# Patient Record
Sex: Male | Born: 1970 | Race: White | Hispanic: No | Marital: Married | Smoking: Never smoker
Health system: Southern US, Community
[De-identification: ages and names within clinical notes are randomized; demographics above are authoritative.]

## PROBLEM LIST (undated history)

## (undated) DIAGNOSIS — K219 Gastro-esophageal reflux disease without esophagitis: Secondary | ICD-10-CM

## (undated) DIAGNOSIS — J302 Other seasonal allergic rhinitis: Secondary | ICD-10-CM

## (undated) DIAGNOSIS — E785 Hyperlipidemia, unspecified: Secondary | ICD-10-CM

## (undated) DIAGNOSIS — U071 COVID-19: Secondary | ICD-10-CM

## (undated) DIAGNOSIS — M1990 Unspecified osteoarthritis, unspecified site: Secondary | ICD-10-CM

## (undated) DIAGNOSIS — Z789 Other specified health status: Secondary | ICD-10-CM

## (undated) DIAGNOSIS — Z9889 Other specified postprocedural states: Secondary | ICD-10-CM

## (undated) HISTORY — DX: Gastro-esophageal reflux disease without esophagitis: K21.9

## (undated) HISTORY — DX: Other specified postprocedural states: Z98.890

## (undated) HISTORY — DX: Other specified health status: Z78.9

## (undated) HISTORY — DX: Hyperlipidemia, unspecified: E78.5

## (undated) HISTORY — PX: INGUINAL HERNIA REPAIR: SUR1180

## (undated) HISTORY — DX: COVID-19: U07.1

## (undated) HISTORY — DX: Other seasonal allergic rhinitis: J30.2

## (undated) HISTORY — PX: ARTHROSCOPY KNEE W/ DRILLING: SUR92

## (undated) HISTORY — PX: KNEE ARTHROSCOPY W/ ACL RECONSTRUCTION: SHX1858

## (undated) HISTORY — PX: UPPER GASTROINTESTINAL ENDOSCOPY: SHX188

## (undated) HISTORY — PX: COLONOSCOPY: SHX174

## (undated) HISTORY — PX: CHOLECYSTECTOMY: SHX55

## (undated) HISTORY — DX: Unspecified osteoarthritis, unspecified site: M19.90

---

## 2006-11-29 ENCOUNTER — Ambulatory Visit: Payer: Self-pay | Admitting: Internal Medicine

## 2008-11-03 ENCOUNTER — Ambulatory Visit: Payer: Self-pay | Admitting: Cardiology

## 2009-09-22 ENCOUNTER — Encounter: Admission: RE | Admit: 2009-09-22 | Discharge: 2009-09-22 | Payer: Self-pay | Admitting: Neurosurgery

## 2010-10-08 ENCOUNTER — Encounter: Payer: Self-pay | Admitting: Neurosurgery

## 2011-03-02 DIAGNOSIS — R002 Palpitations: Secondary | ICD-10-CM

## 2011-03-02 DIAGNOSIS — R0789 Other chest pain: Secondary | ICD-10-CM

## 2013-01-20 ENCOUNTER — Other Ambulatory Visit: Payer: Self-pay | Admitting: Neurosurgery

## 2013-01-20 DIAGNOSIS — M4716 Other spondylosis with myelopathy, lumbar region: Secondary | ICD-10-CM

## 2013-01-29 ENCOUNTER — Ambulatory Visit
Admission: RE | Admit: 2013-01-29 | Discharge: 2013-01-29 | Disposition: A | Discharge status: Home / Self Care | Payer: BC Managed Care – PPO | Source: Ambulatory Visit | Attending: Neurosurgery | Admitting: Neurosurgery

## 2013-01-29 DIAGNOSIS — M4716 Other spondylosis with myelopathy, lumbar region: Secondary | ICD-10-CM

## 2014-05-12 ENCOUNTER — Encounter (INDEPENDENT_AMBULATORY_CARE_PROVIDER_SITE_OTHER): Payer: Self-pay | Admitting: *Deleted

## 2014-08-09 ENCOUNTER — Encounter (INDEPENDENT_AMBULATORY_CARE_PROVIDER_SITE_OTHER): Payer: Self-pay | Admitting: Internal Medicine

## 2014-08-09 ENCOUNTER — Ambulatory Visit (INDEPENDENT_AMBULATORY_CARE_PROVIDER_SITE_OTHER): Payer: BC Managed Care – PPO | Admitting: Internal Medicine

## 2014-08-09 VITALS — BP 110/80 | HR 76 | Temp 99.0°F | Resp 18 | Ht 72.0 in | Wt 195.1 lb

## 2014-08-09 DIAGNOSIS — R1310 Dysphagia, unspecified: Secondary | ICD-10-CM

## 2014-08-09 DIAGNOSIS — K219 Gastro-esophageal reflux disease without esophagitis: Secondary | ICD-10-CM

## 2014-08-09 DIAGNOSIS — R1011 Right upper quadrant pain: Secondary | ICD-10-CM | POA: Insufficient documentation

## 2014-08-09 DIAGNOSIS — M549 Dorsalgia, unspecified: Secondary | ICD-10-CM

## 2014-08-09 DIAGNOSIS — M542 Cervicalgia: Secondary | ICD-10-CM | POA: Insufficient documentation

## 2014-08-09 DIAGNOSIS — G8929 Other chronic pain: Secondary | ICD-10-CM

## 2014-08-09 NOTE — Patient Instructions (Signed)
Symptom diary as discussed. Patient will call with results of gradient study and upper GI series when completed

## 2014-08-09 NOTE — Progress Notes (Signed)
Presenting complaint;  Right artery and abdominal pain.  History of present illness;  Mario Jones is 43 year old Caucasian male who was referred through courtesy of Dr. Selinda FlavinKevin Howard for GI evaluation. Patient was last seen by me over 6 years ago prior to gallbladder surgery. Patient's present illness began in August last year when he developed diarrhea following beach vacation. His diarrhea persisted. His son also had diarrhea which cleared spontaneously. He did not have rectal bleeding fever or chills. Then on 05/07/2013 following evening meal he developed bloating and intractable pain under the right rib cage. Pain was unbearable and he was [facility] and blood work was abnormal and he was thought to have pancreatitis but no changes of pancreatitis were seen on CT. He was seen in consultation by Dr. Jones Skenehristopher Benson who felt that patient did not have pancreatitis. EGD was performed and was within normal limits. His pain eased and he was discharged and scheduled to have endoscopic ultrasound which was performed in August 2014 at [facility] and was within normal limits. Patient has dull aching pain under the right rib cage which is experienced daily if not constant. He has few moments when he is pain-free. In addition to this dull pain he's had 3 episodes of moderate pain lasting for 20 minutes or longer. Last episode occurred 2 days ago and the 2 other episodes occurred more than 3 months ago. These episodes of sharp pain associated with bloating he denies nausea vomiting fever or chills. He also denies melena or rectal bleeding diarrhea or constipation. Last year he lost 45 pounds he has gained 25 pounds back. He has chronic neck and lower back pain secondary to disc disease at multiple levels and he is under care of Dr. Trey SailorsMark Roy. He does not take pain medication on daily basis. Patient also complains of solid food dysphagia which started a few weeks ago. Heartburn is well controlled  with therapy. Review of the systems is negative for hematuria dysuria or skin rash.   Current Medications: Outpatient Encounter Prescriptions as of 08/09/2014  Medication Sig  . HYDROcodone-acetaminophen (NORCO) 10-325 MG per tablet Take 1 tablet by mouth as needed. For back pain  . loratadine (CLARITIN) 10 MG tablet Take 10 mg by mouth daily.  Marland Kitchen. OVER THE COUNTER MEDICATION Take by mouth 2 (two) times daily. [employer]  . pantoprazole (PROTONIX) 40 MG tablet Take 40 mg by mouth daily.  . traZODone (DESYREL) 100 MG tablet Take 50 mg by mouth at bedtime.   Past medical history; Chronic GERD. He has had symptoms more than 10 years. He has chronic neck and lumbar pain secondary to disc disease at multiple levels. Right inguinal herniorrhaphy 13 years ago. Left knee arthroscopy 2 and there are surgery on the same knee was to repair anterior cruciate ligament tear. The surgeries were performed within last 5 years. Cholecystectomy for poorly functioning gallbladder in 2009.  Allergies; No Known Allergies  Family history; Mother is 7864 and has autoimmune hepatitis. Father is 3165 has malformed artery no further details available. He had one sister who died 5 years ago at age 42. She was morbidly obese and had kidney stones.  Social history; He is married and has 2 children ages 159 and 293. 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 He has never smoked cigarettes and does not drink alcohol.   Objective: Blood pressure 110/80, pulse 76, temperature 99 F (37.2 C), temperature source Oral, resp. rate 18, height  6' (1.829 m), weight 195 lb 1.6 oz (88.497 kg). Patient is alert and in no acute distress. Conjunctiva is pink. Sclera is nonicteric Oropharyngeal mucosa is normal. No neck masses or thyromegaly noted. Cardiac exam with regular rhythm normal S1 and S2. No murmur or gallop noted. Lungs are clear to auscultation. Abdomen is symmetrical. Bowel sounds are  normal. Abdomen is soft. There is mild tenderness below the left costal margin. No tenderness noted in epigastric region. No organomegaly or masses. No LE edema or clubbing noted.  Labs/studies Results:   abdominopelvic study from August 2014 reviewed. Large fluid-filled stomach. No abnormality noted to pancreas.  Assessment:  #1. Patient is 43 year old Caucasian male who presents with 15 month history of right upper quadrant abdominal pain. He had an episode of unbearable pain at onset requiring hospitalization but workup was negative including abdominopelvic CT EGD and endoscopic ultrasound. He has chronic dull pain and sharp pain. Transaminases have been normal. He is status post cholecystectomy about 6 years ago. This pain does not appear to be triggered with meals or physical activity. Given his course I doubt that he has SOD dysfunction. His stomach was quite large on CT of August 2014. This finding may have been due to acute illness but need to rule out gastroparesis. Symptoms are not typical of chronic dyspepsia. Chronic pain may be referred from his back. I do not believe repeating a CT while he is not having acute symptoms would be helpful. Symptom diary may shed more light as to the cause of this pain. #2. Solid food dysphagia in a patient with chronic GERD.  Recommendations;  Patient will call if he has another episode of severe pain or report [facility]. Patient advised to keep symptom diary for the next three months. Will schedule barium pill esophagogram and upper GI series. Office visit in 3 months.

## 2014-08-19 ENCOUNTER — Encounter (INDEPENDENT_AMBULATORY_CARE_PROVIDER_SITE_OTHER): Payer: Self-pay | Admitting: *Deleted

## 2014-08-31 ENCOUNTER — Encounter (INDEPENDENT_AMBULATORY_CARE_PROVIDER_SITE_OTHER): Payer: Self-pay

## 2014-09-16 ENCOUNTER — Encounter (INDEPENDENT_AMBULATORY_CARE_PROVIDER_SITE_OTHER): Payer: Self-pay

## 2014-09-29 ENCOUNTER — Encounter (INDEPENDENT_AMBULATORY_CARE_PROVIDER_SITE_OTHER): Payer: Self-pay

## 2014-10-04 ENCOUNTER — Encounter (INDEPENDENT_AMBULATORY_CARE_PROVIDER_SITE_OTHER): Payer: Self-pay | Admitting: Internal Medicine

## 2014-10-04 ENCOUNTER — Ambulatory Visit (INDEPENDENT_AMBULATORY_CARE_PROVIDER_SITE_OTHER): Payer: BLUE CROSS/BLUE SHIELD | Admitting: Internal Medicine

## 2014-10-04 VITALS — BP 122/74 | HR 80 | Temp 98.1°F | Resp 18 | Ht 72.0 in | Wt 200.4 lb

## 2014-10-04 DIAGNOSIS — K3184 Gastroparesis: Secondary | ICD-10-CM

## 2014-10-04 DIAGNOSIS — K219 Gastro-esophageal reflux disease without esophagitis: Secondary | ICD-10-CM

## 2014-10-04 DIAGNOSIS — R1011 Right upper quadrant pain: Secondary | ICD-10-CM

## 2014-10-04 NOTE — Patient Instructions (Signed)
Gastroparesis diet(pamphlet provided to the patient). Can try lower dose of Vicodin and/or tramadol for back pain. Can also use one of each together. Keep symptom diary for three months and call office with progress report

## 2014-10-04 NOTE — Progress Notes (Signed)
Presenting complaint;  Follow-up for RUQ abdominal pain.  Database;  Patient is 44 year old Caucasian male who was last seen on 08/03/2014 for right upper quadrant pain. He's been having intermittent dull pain but then he has episodes of severe pain. Details of the history are summarized in note from that date and will not be repeated. Following his visit he had esophagogram and upper GI series on 08/16/2014. Barium pill passed through esophagus without delay. There appeared to be delayed in passage of contrast from the stomach to duodenum.Marland Kitchen. He returned for gastric emptying study on 08/30/2014 and he had 47% activity in stomach at 2 hours which is felt to be abnormal. It should be 30% or less at 2 hours. He was also advised to keep symptom diary.   Subjective:  Patient presents for scheduled visit. He has kept symptom diary as recommended. He has had for distinct episodes of right upper quadrant abdominal pain. These episodes occurred on Christmas eve, 09/05/2014, 09/24/2014 and 09/25/2014. All of these episodes occurred exactly 1 hour after taking Vicodin or tramadol. Third episode occurred after he took 2 tablets of Vicodin and rest of the episodes occurred after he took 2 pills of tramadol. He experienced pain in the right upper quadrant and each time it lasted from 2-3 hours. Heartburn is well controlled with PPI. He has gained 5 pounds since his last visit. He states he is been eating too much during the holidays. He continues to complain of back pain but he does not take pain medication on daily basis. He states he did not take any pain medication for 24 hours prior to gastric emptying study.   Current Medications: Outpatient Encounter Prescriptions as of 10/04/2014  Medication Sig  . HYDROcodone-acetaminophen (NORCO) 10-325 MG per tablet Take 1 tablet by mouth as needed. For back pain  . loratadine (CLARITIN) 10 MG tablet Take 10 mg by mouth daily.  Marland Kitchen. OVER THE COUNTER MEDICATION Take  by mouth 2 (two) times daily. [employer]  . pantoprazole (PROTONIX) 40 MG tablet Take 40 mg by mouth daily.  . traZODone (DESYREL) 100 MG tablet Take 50 mg by mouth at bedtime.     Objective: Blood pressure 122/74, pulse 80, temperature 98.1 F (36.7 C), temperature source Oral, resp. rate 18, height 6' (1.829 m), weight 200 lb 6.4 oz (90.901 kg). Patient is alert and in no acute distress. Conjunctiva is pink. Sclera is nonicteric Oropharyngeal mucosa is normal. No neck masses or thyromegaly noted. Abdomen is symmetrical soft and nontender. No succussion splash noted.  No LE edema or clubbing noted.  Labs/studies Results:   results of esophagogram and upper GI series and gastric emptying study reviewed under database.  Assessment:  #1. Intermittent right upper quadrant pain. He has kept symptom diary since his last visit and it is quite interesting to note that all 4 episodes of sharp pain occurred within an hour of taking vicodin and or tramadol. Mechanism could be nonspecific side effect of medication or SOD spasm. By keeping symptom diary we may be able to get to the bottom. #2. Mild gastroparesis. He is on dietary measures and PPI. #3. GERD. Heartburn is well controlled with PPI.   Plan:  Patient will keep symptom diary for another three months. He can try taking 1 Vicodin or 1 tramadol at a time and if he has no problem with either he can combine the two and see what happens. I would also like for him to take Vicodin and  tramadol couple of times when he is not having back pain. If he has prolonged right upper quadrant pain will check LFTs and serum amylase and lipase. He will call with progress report in three months. Office visit in one year or earlier if needed.

## 2014-11-08 ENCOUNTER — Ambulatory Visit (INDEPENDENT_AMBULATORY_CARE_PROVIDER_SITE_OTHER): Payer: BC Managed Care – PPO | Admitting: Internal Medicine

## 2014-11-22 ENCOUNTER — Ambulatory Visit (INDEPENDENT_AMBULATORY_CARE_PROVIDER_SITE_OTHER): Payer: BC Managed Care – PPO | Admitting: Internal Medicine

## 2015-06-10 ENCOUNTER — Encounter (INDEPENDENT_AMBULATORY_CARE_PROVIDER_SITE_OTHER): Payer: Self-pay | Admitting: *Deleted

## 2015-10-05 ENCOUNTER — Ambulatory Visit (INDEPENDENT_AMBULATORY_CARE_PROVIDER_SITE_OTHER): Payer: BLUE CROSS/BLUE SHIELD | Admitting: Internal Medicine

## 2015-12-13 ENCOUNTER — Ambulatory Visit (INDEPENDENT_AMBULATORY_CARE_PROVIDER_SITE_OTHER): Payer: BLUE CROSS/BLUE SHIELD | Admitting: Internal Medicine

## 2015-12-16 ENCOUNTER — Encounter: Payer: Self-pay | Admitting: Cardiology

## 2015-12-16 ENCOUNTER — Ambulatory Visit (HOSPITAL_COMMUNITY)
Admission: RE | Admit: 2015-12-16 | Discharge: 2015-12-16 | Disposition: A | Discharge status: Home / Self Care | Payer: BLUE CROSS/BLUE SHIELD | Source: Ambulatory Visit | Attending: Cardiology | Admitting: Cardiology

## 2015-12-16 ENCOUNTER — Other Ambulatory Visit: Payer: Self-pay | Admitting: Cardiology

## 2015-12-16 ENCOUNTER — Ambulatory Visit (INDEPENDENT_AMBULATORY_CARE_PROVIDER_SITE_OTHER): Payer: BLUE CROSS/BLUE SHIELD | Admitting: Cardiology

## 2015-12-16 VITALS — BP 116/90 | HR 78 | Ht 72.0 in | Wt 207.0 lb

## 2015-12-16 DIAGNOSIS — E785 Hyperlipidemia, unspecified: Secondary | ICD-10-CM

## 2015-12-16 DIAGNOSIS — R072 Precordial pain: Secondary | ICD-10-CM | POA: Diagnosis not present

## 2015-12-16 DIAGNOSIS — I200 Unstable angina: Secondary | ICD-10-CM | POA: Insufficient documentation

## 2015-12-16 DIAGNOSIS — Z8249 Family history of ischemic heart disease and other diseases of the circulatory system: Secondary | ICD-10-CM

## 2015-12-16 DIAGNOSIS — Z01818 Encounter for other preprocedural examination: Secondary | ICD-10-CM | POA: Insufficient documentation

## 2015-12-16 DIAGNOSIS — K219 Gastro-esophageal reflux disease without esophagitis: Secondary | ICD-10-CM | POA: Diagnosis not present

## 2015-12-16 DIAGNOSIS — Z789 Other specified health status: Secondary | ICD-10-CM

## 2015-12-16 NOTE — Patient Instructions (Signed)
Your physician recommends that you schedule a follow-up appointment in: [facility],apt will be made at the hospital  Your physician has requested that you have a cardiac catheterization. Cardiac catheterization is used to diagnose and/or treat various heart conditions. Doctors may recommend this procedure for a number of different reasons. The most common reason is to evaluate chest pain. Chest pain can be a symptom of coronary artery disease (CAD), and cardiac catheterization can show whether plaque is narrowing or blocking your heart's arteries. This procedure is also used to evaluate the valves, as well as measure the blood flow and oxygen levels in different parts of your heart. For further information please visit [email or web address]. Please follow instruction sheet, as given.    Your physician has requested that you have an echocardiogram. Echocardiography is a painless test that uses sound waves to create images of your heart. It provides your doctor with information about the size and shape of your heart and how well your heart's chambers and valves are working. This procedure takes approximately one hour. There are no restrictions for this procedure.     Your physician recommends that you continue on your current medications as directed. Please refer to the Current Medication list given to you today.     Thank you for choosing  [facility] !

## 2015-12-16 NOTE — Progress Notes (Signed)
Cardiology Office Note  Date: 12/16/2015   ID: Mario Jones, DOB 1970/12/27, MRN [id number]  PCP: Selinda Flavin, MD  Consulting Cardiologist: Nona Dell, MD   Chief Complaint  Patient presents with  . Chest pain and fatigue    History of Present Illness: Mario Jones is a 45 y.o. male self-referred for cardiology consultation. He is here with his wife today. He states over the last several months he has been more fatigued with activity. He works as a Arboriculturist, very physical in general. Within the last few months he has also been experiencing a vague discomfort in his left chest, with radiation to the left arm and jaw. He states that he has not had to stop his general activities related to these symptoms, but tends to notice them more at the end of the day when he is home. He has also experienced intermittent palpitations. He does not have any personal history of CAD or cardiomyopathy. Family history of premature CAD is present in his father, his sister also died suddenly in her 9s. She reportedly had an "enlarged heart."   He has a history of hyperlipidemia with statin intolerance, no trouble with hypertension or diabetes mellitus. ECG done today in the office and reviewed by me showed normal sinus rhythm.   He underwent cardiac stress testing approximately 5 or 6 years ago at [facility], does not recall any significant abnormalities. He has not had any interval ischemic testing. He was not having symptoms similar to now at that time.  Today we discussed further evaluation for potential obstructive CAD and cardiomyopathy. We discussed noninvasive and invasive techniques, and after reviewing the risks and benefits, plan is for him to proceed with a diagnostic cardiac catheterization to clearly define coronary anatomy. We will also obtain an echocardiogram to assess cardiac structure and function.  Past Medical History  Diagnosis Date  . GERD (gastroesophageal reflux  disease)   . Seasonal allergies   . Hyperlipidemia   . Osteoarthritis   . Statin intolerance     Past Surgical History  Procedure Laterality Date  . Cholecystectomy    . Inguinal hernia repair    . Colonoscopy    . Upper gastrointestinal endoscopy    . Arthroscopy knee w/ drilling    . Knee arthroscopy w/ acl reconstruction      Current Outpatient Prescriptions  Medication Sig Dispense Refill  . HYDROcodone-acetaminophen (NORCO) 10-325 MG per tablet Take 1 tablet by mouth as needed. For back pain    . loratadine (CLARITIN) 10 MG tablet Take 10 mg by mouth daily.    . pantoprazole (PROTONIX) 40 MG tablet Take 40 mg by mouth daily.    . traZODone (DESYREL) 100 MG tablet Take 50 mg by mouth at bedtime.    . ranitidine (ZANTAC) 150 MG capsule Take 150 mg by mouth every evening.      No current facility-administered medications for this visit.   Allergies:  Review of patient's allergies indicates no known allergies.   Social History: The patient  reports that he has never smoked. He quit smokeless tobacco use about 26 years ago. His smokeless tobacco use included Chew. He reports that he does not drink alcohol or use illicit drugs.   Family History: The patient's family history includes Asthma in his son; Autoimmune disease in his mother; Healthy in his son; Heart disease in his father and sister; Migraines in his mother.   ROS:  Please see the history of present illness. Otherwise,  complete review of systems is positive for none.  All other systems are reviewed and negative.   Physical Exam: VS:  BP 116/90 mmHg  Pulse 78  Ht 6' (1.829 m)  Wt 207 lb (93.895 kg)  BMI 28.07 kg/m2  SpO2 98%, BMI Body mass index is 28.07 kg/(m^2).  Wt Readings from Last 3 Encounters:  12/16/15 207 lb (93.895 kg)  10/04/14 200 lb 6.4 oz (90.901 kg)  08/09/14 195 lb 1.6 oz (88.497 kg)    General: Patient appears comfortable at rest. HEENT: Conjunctiva and lids normal, oropharynx clear with moist  mucosa. Neck: Supple, no elevated JVP or carotid bruits, no thyromegaly. Lungs: Clear to auscultation, nonlabored breathing at rest. Cardiac: Regular rate and rhythm, no S3 or significant systolic murmur, no pericardial rub. Abdomen: Soft, nontender, bowel sounds present, no guarding or rebound. Extremities: No pitting edema, distal pulses 2+. Skin: Warm and dry. Musculoskeletal: No kyphosis. Neuropsychiatric: Alert and oriented x3, affect grossly appropriate.  ECG: ECG is ordered today. No old tracing for comparison.  Other Studies Reviewed Today:  Unable to access old stress test report from [facility].  Previous gastric emptying study from 08/16/2014 showed delayed gastric emptying with 47% retention at 120 minutes. He follows with Dr. Karilyn Cotaehman.  Assessment and Plan:  1. Exertional fatigue with intermittent left-sided chest discomfort radiating to the arm and jaw concerning for accelerating angina symptoms. Reports worsening over the last few months and otherwise generalized fatigue that has been longer standing. He has a family history of premature CAD, personal history of hyperlipidemia with statin intolerance. Otherwise no major cardiac risk factors. ECG today is normal. He underwent stress testing about 5 years ago that was unrevealing, and the present symptoms are new. We discussed both noninvasive and invasive techniques for further cardiac assessment, and after reviewing the risks and benefits, plan is to proceed with a diagnostic cardiac catheterization to clearly define coronary anatomy. We will also obtain an echocardiogram to exclude cardiomyopathy.  2. History of GERD and also delayed gastric emptying. He has seen Dr. Karilyn Cotaehman for this.  3. Hyperlipidemia with statin intolerance.  Current medicines were reviewed with the patient today.   Orders Placed This Encounter  Procedures  . EKG 12-Lead  . Echocardiogram    Disposition: FU with me after cardiac  catheterization.   Signed, Jonelle SidleSamuel G. Aoi Kouns, MD, [facility] 12/16/2015 2:10 PM    [facility] at [facility] [address], [place], [place] [address] Phone: [phone]; Fax: [phone]) [phone]

## 2015-12-21 ENCOUNTER — Encounter: Payer: Self-pay | Admitting: *Deleted

## 2015-12-21 ENCOUNTER — Other Ambulatory Visit: Payer: Self-pay | Admitting: *Deleted

## 2015-12-21 ENCOUNTER — Ambulatory Visit: Payer: Self-pay

## 2015-12-21 ENCOUNTER — Telehealth: Payer: Self-pay | Admitting: Cardiovascular Disease

## 2015-12-21 ENCOUNTER — Other Ambulatory Visit: Payer: Self-pay

## 2015-12-21 ENCOUNTER — Ambulatory Visit (HOSPITAL_COMMUNITY)
Admission: RE | Admit: 2015-12-21 | Discharge: 2015-12-21 | Disposition: A | Discharge status: Home / Self Care | Payer: BLUE CROSS/BLUE SHIELD | Source: Ambulatory Visit | Attending: Cardiovascular Disease | Admitting: Cardiovascular Disease

## 2015-12-21 ENCOUNTER — Other Ambulatory Visit: Payer: Self-pay | Admitting: Cardiovascular Disease

## 2015-12-21 ENCOUNTER — Ambulatory Visit (INDEPENDENT_AMBULATORY_CARE_PROVIDER_SITE_OTHER): Payer: BLUE CROSS/BLUE SHIELD

## 2015-12-21 ENCOUNTER — Telehealth: Payer: Self-pay | Admitting: Cardiology

## 2015-12-21 DIAGNOSIS — I7100 Dissection of unspecified site of aorta: Secondary | ICD-10-CM

## 2015-12-21 DIAGNOSIS — Z0389 Encounter for observation for other suspected diseases and conditions ruled out: Secondary | ICD-10-CM | POA: Diagnosis not present

## 2015-12-21 DIAGNOSIS — K219 Gastro-esophageal reflux disease without esophagitis: Secondary | ICD-10-CM | POA: Diagnosis not present

## 2015-12-21 DIAGNOSIS — R079 Chest pain, unspecified: Secondary | ICD-10-CM | POA: Diagnosis not present

## 2015-12-21 DIAGNOSIS — I200 Unstable angina: Secondary | ICD-10-CM

## 2015-12-21 DIAGNOSIS — Z87891 Personal history of nicotine dependence: Secondary | ICD-10-CM | POA: Diagnosis not present

## 2015-12-21 DIAGNOSIS — E785 Hyperlipidemia, unspecified: Secondary | ICD-10-CM | POA: Diagnosis not present

## 2015-12-21 DIAGNOSIS — Z8249 Family history of ischemic heart disease and other diseases of the circulatory system: Secondary | ICD-10-CM | POA: Diagnosis not present

## 2015-12-21 DIAGNOSIS — Q245 Malformation of coronary vessels: Secondary | ICD-10-CM | POA: Diagnosis not present

## 2015-12-21 DIAGNOSIS — Z79899 Other long term (current) drug therapy: Secondary | ICD-10-CM | POA: Diagnosis not present

## 2015-12-21 MED ORDER — IOHEXOL 350 MG/ML SOLN
100.0000 mL | Freq: Once | INTRAVENOUS | Status: AC | PRN
  Administered 2015-12-21: 100 mL via INTRAVENOUS

## 2015-12-21 NOTE — Progress Notes (Unsigned)
Patient ID: Mario LesserKevin D Jones, male   DOB: June 22, 1971, 45 y.o.   MRN: [id number]  Asked by sonographer to review echocardiographic images suspicious for thoracic aortic dissection flap. Due to system malfunction/technical difficulties, unable to officially complete report at this time. However, there is an area that is certainly suspicious for a dissection flap with noticeable flow in the dissection region. I will order a CT angiography of the chest to evaluate for dissection, as he is in the process of being scheduled for coronary angiography. I have communicated these findings with Dr. Diona BrownerMcDowell, his cardiologist.

## 2015-12-21 NOTE — Telephone Encounter (Signed)
    STAT CTA AORTA DX: AORTIC DISSECTION   [facility] now 12/21/15  -checking percert   (STAT)

## 2015-12-21 NOTE — Telephone Encounter (Signed)
  [facility] report on CT which was ordered to assess for aortic dissection. The study was negative for dissection. Patient was notified of results.   SIMMONS, BRITTAINY 12/21/2015

## 2015-12-22 ENCOUNTER — Encounter (HOSPITAL_COMMUNITY): Payer: Self-pay | Admitting: Cardiovascular Disease

## 2015-12-22 ENCOUNTER — Ambulatory Visit (HOSPITAL_COMMUNITY)
Admission: RE | Admit: 2015-12-22 | Discharge: 2015-12-22 | Disposition: A | Discharge status: Home / Self Care | Payer: BLUE CROSS/BLUE SHIELD | Source: Ambulatory Visit | Attending: Cardiovascular Disease | Admitting: Cardiovascular Disease

## 2015-12-22 ENCOUNTER — Telehealth: Payer: Self-pay | Admitting: *Deleted

## 2015-12-22 ENCOUNTER — Encounter (HOSPITAL_COMMUNITY)
Admission: RE | Disposition: A | Discharge status: Home / Self Care | Payer: Self-pay | Source: Ambulatory Visit | Attending: Cardiovascular Disease

## 2015-12-22 DIAGNOSIS — Z79899 Other long term (current) drug therapy: Secondary | ICD-10-CM | POA: Insufficient documentation

## 2015-12-22 DIAGNOSIS — K219 Gastro-esophageal reflux disease without esophagitis: Secondary | ICD-10-CM | POA: Insufficient documentation

## 2015-12-22 DIAGNOSIS — Z87891 Personal history of nicotine dependence: Secondary | ICD-10-CM | POA: Insufficient documentation

## 2015-12-22 DIAGNOSIS — R079 Chest pain, unspecified: Secondary | ICD-10-CM | POA: Diagnosis not present

## 2015-12-22 DIAGNOSIS — Q245 Malformation of coronary vessels: Secondary | ICD-10-CM | POA: Diagnosis not present

## 2015-12-22 DIAGNOSIS — I208 Other forms of angina pectoris: Secondary | ICD-10-CM | POA: Diagnosis present

## 2015-12-22 DIAGNOSIS — E785 Hyperlipidemia, unspecified: Secondary | ICD-10-CM | POA: Diagnosis not present

## 2015-12-22 DIAGNOSIS — I2089 Other forms of angina pectoris: Secondary | ICD-10-CM | POA: Diagnosis present

## 2015-12-22 DIAGNOSIS — I200 Unstable angina: Secondary | ICD-10-CM

## 2015-12-22 DIAGNOSIS — Z8249 Family history of ischemic heart disease and other diseases of the circulatory system: Secondary | ICD-10-CM | POA: Insufficient documentation

## 2015-12-22 HISTORY — PX: CARDIAC CATHETERIZATION: SHX172

## 2015-12-22 LAB — CBC
HCT: 41.9 % (ref 39.0–52.0)
HEMOGLOBIN: 15.3 g/dL (ref 13.0–17.0)
MCH: 32.1 pg (ref 26.0–34.0)
MCHC: 36.5 g/dL — AB (ref 30.0–36.0)
MCV: 88 fL (ref 78.0–100.0)
Platelets: 277 10*3/uL (ref 150–400)
RBC: 4.76 MIL/uL (ref 4.22–5.81)
RDW: 12.6 % (ref 11.5–15.5)
WBC: 6 10*3/uL (ref 4.0–10.5)

## 2015-12-22 LAB — BASIC METABOLIC PANEL
ANION GAP: 11 (ref 5–15)
BUN: 11 mg/dL (ref 6–20)
CALCIUM: 9.8 mg/dL (ref 8.9–10.3)
CO2: 25 mmol/L (ref 22–32)
Chloride: 104 mmol/L (ref 101–111)
Creatinine, Ser: 0.86 mg/dL (ref 0.61–1.24)
GFR calc Af Amer: >60 mL/min (ref >60)
GFR calc non Af Amer: >60 mL/min (ref >60)
GLUCOSE: 106 mg/dL — AB (ref 65–99)
Potassium: 4.1 mmol/L (ref 3.5–5.1)
SODIUM: 140 mmol/L (ref 135–145)

## 2015-12-22 LAB — PROTIME-INR
INR: 1.01 (ref 0.00–1.49)
PROTHROMBIN TIME: 13.5 s (ref 11.6–15.2)

## 2015-12-22 SURGERY — LEFT HEART CATH AND CORONARY ANGIOGRAPHY
Anesthesia: LOCAL

## 2015-12-22 MED ORDER — ONDANSETRON HCL 4 MG/2ML IJ SOLN: 4.0000 mg | Freq: Four times a day (QID) | INTRAMUSCULAR | Status: DC | PRN

## 2015-12-22 MED ORDER — FENTANYL CITRATE (PF) 100 MCG/2ML IJ SOLN
INTRAMUSCULAR | Status: AC
  Filled 2015-12-22: qty 2

## 2015-12-22 MED ORDER — SODIUM CHLORIDE 0.9% FLUSH: 3.0000 mL | INTRAVENOUS | Status: DC | PRN

## 2015-12-22 MED ORDER — MIDAZOLAM HCL 2 MG/2ML IJ SOLN
INTRAMUSCULAR | Status: DC | PRN
  Administered 2015-12-22: 2 mg via INTRAVENOUS

## 2015-12-22 MED ORDER — ACETAMINOPHEN 325 MG PO TABS: 650.0000 mg | ORAL_TABLET | ORAL | Status: DC | PRN

## 2015-12-22 MED ORDER — MIDAZOLAM HCL 2 MG/2ML IJ SOLN
INTRAMUSCULAR | Status: AC
  Filled 2015-12-22: qty 2

## 2015-12-22 MED ORDER — OXYCODONE-ACETAMINOPHEN 5-325 MG PO TABS
ORAL_TABLET | ORAL | Status: AC
  Filled 2015-12-22: qty 1

## 2015-12-22 MED ORDER — IOPAMIDOL (ISOVUE-370) INJECTION 76%
INTRAVENOUS | Status: DC | PRN
  Administered 2015-12-22: 40 mL via INTRAVENOUS

## 2015-12-22 MED ORDER — HEPARIN (PORCINE) IN NACL 2-0.9 UNIT/ML-% IJ SOLN
INTRAMUSCULAR | Status: DC | PRN
  Administered 2015-12-22: 13:00:00 via INTRA_ARTERIAL

## 2015-12-22 MED ORDER — VERAPAMIL HCL 2.5 MG/ML IV SOLN
INTRAVENOUS | Status: AC
  Filled 2015-12-22: qty 2

## 2015-12-22 MED ORDER — HEPARIN SODIUM (PORCINE) 1000 UNIT/ML IJ SOLN
INTRAMUSCULAR | Status: AC
  Filled 2015-12-22: qty 1

## 2015-12-22 MED ORDER — DIAZEPAM 5 MG PO TABS
ORAL_TABLET | ORAL | Status: AC
  Filled 2015-12-22: qty 1

## 2015-12-22 MED ORDER — DIAZEPAM 5 MG PO TABS
5.0000 mg | ORAL_TABLET | Freq: Once | ORAL | Status: AC
  Administered 2015-12-22: 5 mg via ORAL

## 2015-12-22 MED ORDER — SODIUM CHLORIDE 0.9 % IV SOLN: 250.0000 mL | INTRAVENOUS | Status: DC | PRN

## 2015-12-22 MED ORDER — HEPARIN SODIUM (PORCINE) 1000 UNIT/ML IJ SOLN
INTRAMUSCULAR | Status: DC | PRN
  Administered 2015-12-22: 5000 [IU] via INTRAVENOUS

## 2015-12-22 MED ORDER — LIDOCAINE HCL (PF) 1 % IJ SOLN
INTRAMUSCULAR | Status: DC | PRN
  Administered 2015-12-22: 3 mL via SUBCUTANEOUS

## 2015-12-22 MED ORDER — SODIUM CHLORIDE 0.9 % IV SOLN: INTRAVENOUS | Status: DC

## 2015-12-22 MED ORDER — FENTANYL CITRATE (PF) 100 MCG/2ML IJ SOLN
INTRAMUSCULAR | Status: DC | PRN
  Administered 2015-12-22: 50 ug via INTRAVENOUS

## 2015-12-22 MED ORDER — SODIUM CHLORIDE 0.9% FLUSH: 3.0000 mL | Freq: Two times a day (BID) | INTRAVENOUS | Status: DC

## 2015-12-22 MED ORDER — IOPAMIDOL (ISOVUE-370) INJECTION 76%
INTRAVENOUS | Status: AC
  Filled 2015-12-22: qty 100

## 2015-12-22 MED ORDER — LIDOCAINE HCL (PF) 1 % IJ SOLN
INTRAMUSCULAR | Status: AC
  Filled 2015-12-22: qty 30

## 2015-12-22 MED ORDER — OXYCODONE-ACETAMINOPHEN 5-325 MG PO TABS
1.0000 | ORAL_TABLET | Freq: Once | ORAL | Status: AC
  Administered 2015-12-22: 1 via ORAL

## 2015-12-22 MED ORDER — HEPARIN (PORCINE) IN NACL 2-0.9 UNIT/ML-% IJ SOLN
INTRAMUSCULAR | Status: AC
  Filled 2015-12-22: qty 1000

## 2015-12-22 MED ORDER — SODIUM CHLORIDE 0.9 % IV SOLN
INTRAVENOUS | Status: DC
  Administered 2015-12-22: 08:00:00 via INTRAVENOUS

## 2015-12-22 MED ORDER — HEPARIN (PORCINE) IN NACL 2-0.9 UNIT/ML-% IJ SOLN
INTRAMUSCULAR | Status: DC | PRN
  Administered 2015-12-22: 1000 mL via INTRA_ARTERIAL

## 2015-12-22 MED ORDER — ASPIRIN 81 MG PO CHEW: 81.0000 mg | CHEWABLE_TABLET | ORAL | Status: DC

## 2015-12-22 SURGICAL SUPPLY — 11 items
CATH INFINITI 5 FR JL3.5 (CATHETERS) ×1 IMPLANT
CATH INFINITI 5FR ANG PIGTAIL (CATHETERS) ×1 IMPLANT
CATH INFINITI JR4 5F (CATHETERS) ×1 IMPLANT
DEVICE RAD COMP TR BAND LRG (VASCULAR PRODUCTS) ×1 IMPLANT
GLIDESHEATH SLEND SS 6F .021 (SHEATH) ×1 IMPLANT
KIT HEART LEFT (KITS) ×2 IMPLANT
PACK CARDIAC CATHETERIZATION (CUSTOM PROCEDURE TRAY) ×2 IMPLANT
SYR MEDRAD MARK V 150ML (SYRINGE) ×1 IMPLANT
TRANSDUCER W/STOPCOCK (MISCELLANEOUS) ×2 IMPLANT
TUBING CIL FLEX 10 FLL-RA (TUBING) ×2 IMPLANT
WIRE SAFE-T 1.5MM-J .035X260CM (WIRE) ×2 IMPLANT

## 2015-12-22 NOTE — Telephone Encounter (Signed)
-----   Message from Jonelle SidleSamuel G McDowell, MD sent at 12/21/2015  6:59 PM EDT ----- Reviewed report this evening - out of the office this afternoon. Normal LVEF of 60-65%. Possible abnormality in the aorta was likely artifact since subsequent stat chest CT was negative for aortic dissection. I will review the echo images when back in the office. Continue with same plan as per recent office visit.

## 2015-12-22 NOTE — Telephone Encounter (Signed)
-----   Message from Laqueta LindenSuresh A Koneswaran, MD sent at 12/22/2015  9:36 AM EDT ----- No dissection. Abnormality seen on echocardiographic images likely artifactual in nature.

## 2015-12-22 NOTE — Interval H&P Note (Signed)
Cath Lab Visit (complete for each Cath Lab visit)  Clinical Evaluation Leading to the Procedure:   ACS: No.  Non-ACS:    Anginal Classification: CCS III  Anti-ischemic medical therapy: No Therapy  Non-Invasive Test Results: No non-invasive testing performed  Prior CABG: No previous CABG      History and Physical Interval Note:  12/22/2015 12:32 PM  Mario Jones  has presented today for surgery, with the diagnosis of angina  The various methods of treatment have been discussed with the patient and family. After consideration of risks, benefits and other options for treatment, the patient has consented to  Procedure(s): Left Heart Cath and Coronary Angiography (N/A) as a surgical intervention .  The patient's history has been reviewed, patient examined, no change in status, stable for surgery.  I have reviewed the patient's chart and labs.  Questions were answered to the patient's satisfaction.     Tonny Bollmanooper, Melvine Julin

## 2015-12-22 NOTE — Telephone Encounter (Signed)
Auth # [id number] valid 12/21/15 only

## 2015-12-22 NOTE — Discharge Instructions (Signed)
Radial Site Care °Refer to this sheet in the next few weeks. These instructions provide you with information about caring for yourself after your procedure. Your health care provider may also give you more specific instructions. Your treatment has been planned according to current medical practices, but problems sometimes occur. Call your health care provider if you have any problems or questions after your procedure. °WHAT TO EXPECT AFTER THE PROCEDURE °After your procedure, it is typical to have the following: °· Bruising at the radial site that usually fades within 1-2 weeks. °· Blood collecting in the tissue (hematoma) that may be painful to the touch. It should usually decrease in size and tenderness within 1-2 weeks. °HOME CARE INSTRUCTIONS °· Take medicines only as directed by your health care provider. °· You may shower 24-48 hours after the procedure or as directed by your health care provider. Remove the bandage (dressing) and gently wash the site with plain soap and water. Pat the area dry with a clean towel. Do not rub the site, because this may cause bleeding. °· Do not take baths, swim, or use a hot tub until your health care provider approves. °· Check your insertion site every day for redness, swelling, or drainage. °· Do not apply powder or lotion to the site. °· Do not flex or bend the affected arm for 24 hours or as directed by your health care provider. °· Do not push or pull heavy objects with the affected arm for 24 hours or as directed by your health care provider. °· Do not lift over 10 lb (4.5 kg) for 5 days after your procedure or as directed by your health care provider. °· Ask your health care provider when it is okay to: °¨ Return to work or school. °¨ Resume usual physical activities or sports. °¨ Resume sexual activity. °· Do not drive home if you are discharged the same day as the procedure. Have someone else drive you. °· You may drive 24 hours after the procedure unless otherwise  instructed by your health care provider. °· Do not operate machinery or power tools for 24 hours after the procedure. °· If your procedure was done as an outpatient procedure, which means that you went home the same day as your procedure, a responsible adult should be with you for the first 24 hours after you arrive home. °· Keep all follow-up visits as directed by your health care provider. This is important. °SEEK MEDICAL CARE IF: °· You have a fever. °· You have chills. °· You have increased bleeding from the radial site. Hold pressure on the site. °SEEK IMMEDIATE MEDICAL CARE IF: °· You have unusual pain at the radial site. °· You have redness, warmth, or swelling at the radial site. °· You have drainage (other than a small amount of blood on the dressing) from the radial site. °· The radial site is bleeding, hold steady pressure on the site and call 911. °· Your arm or hand becomes pale, cool, tingly, or numb. °  °This information is not intended to replace advice given to you by your health care provider. Make sure you discuss any questions you have with your health care provider. °  °Document Released: 10/06/2010 Document Revised: 09/24/2014 Document Reviewed: 03/22/2014 °Elsevier Interactive Patient Education ©2016 [employer] ° °

## 2015-12-22 NOTE — H&P (View-Only) (Signed)
Cardiology Office Note  Date: 12/16/2015   ID: Mario Jones, DOB 1970/10/25, MRN [id number]  PCP: Selinda Flavin, MD  Consulting Cardiologist: Nona Dell, MD   Chief Complaint  Patient presents with  . Chest pain and fatigue    History of Present Illness: Mario Jones is a 44 y.o. male self-referred for cardiology consultation. He is here with his wife today. He states over the last several months he has been more fatigued with activity. He works as a Arboriculturist, very physical in general. Within the last few months he has also been experiencing a vague discomfort in his left chest, with radiation to the left arm and jaw. He states that he has not had to stop his general activities related to these symptoms, but tends to notice them more at the end of the day when he is home. He has also experienced intermittent palpitations. He does not have any personal history of CAD or cardiomyopathy. Family history of premature CAD is present in his father, his sister also died suddenly in her 61s. She reportedly had an "enlarged heart."   He has a history of hyperlipidemia with statin intolerance, no trouble with hypertension or diabetes mellitus. ECG done today in the office and reviewed by me showed normal sinus rhythm.   He underwent cardiac stress testing approximately 5 or 6 years ago at [facility], does not recall any significant abnormalities. He has not had any interval ischemic testing. He was not having symptoms similar to now at that time.  Today we discussed further evaluation for potential obstructive CAD and cardiomyopathy. We discussed noninvasive and invasive techniques, and after reviewing the risks and benefits, plan is for him to proceed with a diagnostic cardiac catheterization to clearly define coronary anatomy. We will also obtain an echocardiogram to assess cardiac structure and function.  Past Medical History  Diagnosis Date  . GERD (gastroesophageal reflux  disease)   . Seasonal allergies   . Hyperlipidemia   . Osteoarthritis   . Statin intolerance     Past Surgical History  Procedure Laterality Date  . Cholecystectomy    . Inguinal hernia repair    . Colonoscopy    . Upper gastrointestinal endoscopy    . Arthroscopy knee w/ drilling    . Knee arthroscopy w/ acl reconstruction      Current Outpatient Prescriptions  Medication Sig Dispense Refill  . HYDROcodone-acetaminophen (NORCO) 10-325 MG per tablet Take 1 tablet by mouth as needed. For back pain    . loratadine (CLARITIN) 10 MG tablet Take 10 mg by mouth daily.    . pantoprazole (PROTONIX) 40 MG tablet Take 40 mg by mouth daily.    . traZODone (DESYREL) 100 MG tablet Take 50 mg by mouth at bedtime.    . ranitidine (ZANTAC) 150 MG capsule Take 150 mg by mouth every evening.      No current facility-administered medications for this visit.   Allergies:  Review of patient's allergies indicates no known allergies.   Social History: The patient  reports that he has never smoked. He quit smokeless tobacco use about 26 years ago. His smokeless tobacco use included Chew. He reports that he does not drink alcohol or use illicit drugs.   Family History: The patient's family history includes Asthma in his son; Autoimmune disease in his mother; Healthy in his son; Heart disease in his father and sister; Migraines in his mother.   ROS:  Please see the history of present illness. Otherwise,  complete review of systems is positive for none.  All other systems are reviewed and negative.   Physical Exam: VS:  BP 116/90 mmHg  Pulse 78  Ht 6' (1.829 m)  Wt 207 lb (93.895 kg)  BMI 28.07 kg/m2  SpO2 98%, BMI Body mass index is 28.07 kg/(m^2).  Wt Readings from Last 3 Encounters:  12/16/15 207 lb (93.895 kg)  10/04/14 200 lb 6.4 oz (90.901 kg)  08/09/14 195 lb 1.6 oz (88.497 kg)    General: Patient appears comfortable at rest. HEENT: Conjunctiva and lids normal, oropharynx clear with moist  mucosa. Neck: Supple, no elevated JVP or carotid bruits, no thyromegaly. Lungs: Clear to auscultation, nonlabored breathing at rest. Cardiac: Regular rate and rhythm, no S3 or significant systolic murmur, no pericardial rub. Abdomen: Soft, nontender, bowel sounds present, no guarding or rebound. Extremities: No pitting edema, distal pulses 2+. Skin: Warm and dry. Musculoskeletal: No kyphosis. Neuropsychiatric: Alert and oriented x3, affect grossly appropriate.  ECG: ECG is ordered today. No old tracing for comparison.  Other Studies Reviewed Today:  Unable to access old stress test report from [facility].  Previous gastric emptying study from 08/16/2014 showed delayed gastric emptying with 47% retention at 120 minutes. He follows with Dr. Karilyn Cotaehman.  Assessment and Plan:  1. Exertional fatigue with intermittent left-sided chest discomfort radiating to the arm and jaw concerning for accelerating angina symptoms. Reports worsening over the last few months and otherwise generalized fatigue that has been longer standing. He has a family history of premature CAD, personal history of hyperlipidemia with statin intolerance. Otherwise no major cardiac risk factors. ECG today is normal. He underwent stress testing about 5 years ago that was unrevealing, and the present symptoms are new. We discussed both noninvasive and invasive techniques for further cardiac assessment, and after reviewing the risks and benefits, plan is to proceed with a diagnostic cardiac catheterization to clearly define coronary anatomy. We will also obtain an echocardiogram to exclude cardiomyopathy.  2. History of GERD and also delayed gastric emptying. He has seen Dr. Karilyn Cotaehman for this.  3. Hyperlipidemia with statin intolerance.  Current medicines were reviewed with the patient today.   Orders Placed This Encounter  Procedures  . EKG 12-Lead  . Echocardiogram    Disposition: FU with me after cardiac  catheterization.   Signed, Jonelle SidleSamuel G. Prinston Kynard, MD, [facility] 12/16/2015 2:10 PM    [facility] at [facility] [address], [place], [place] [address] Phone: [phone]; Fax: [phone]

## 2015-12-23 ENCOUNTER — Encounter (HOSPITAL_COMMUNITY): Payer: Self-pay | Admitting: Cardiovascular Disease

## 2015-12-26 ENCOUNTER — Ambulatory Visit (INDEPENDENT_AMBULATORY_CARE_PROVIDER_SITE_OTHER): Payer: BLUE CROSS/BLUE SHIELD | Admitting: Cardiology

## 2015-12-26 ENCOUNTER — Encounter: Payer: Self-pay | Admitting: Cardiology

## 2015-12-26 VITALS — BP 123/87 | HR 78 | Ht 72.0 in | Wt 210.2 lb

## 2015-12-26 DIAGNOSIS — M542 Cervicalgia: Secondary | ICD-10-CM

## 2015-12-26 DIAGNOSIS — R0600 Dyspnea, unspecified: Secondary | ICD-10-CM

## 2015-12-26 DIAGNOSIS — Z889 Allergy status to unspecified drugs, medicaments and biological substances status: Secondary | ICD-10-CM

## 2015-12-26 DIAGNOSIS — E785 Hyperlipidemia, unspecified: Secondary | ICD-10-CM

## 2015-12-26 DIAGNOSIS — R0609 Other forms of dyspnea: Secondary | ICD-10-CM | POA: Diagnosis not present

## 2015-12-26 DIAGNOSIS — Z789 Other specified health status: Secondary | ICD-10-CM

## 2015-12-26 NOTE — Patient Instructions (Signed)
Your physician recommends that you continue on your current medications as directed. Please refer to the Current Medication list given to you today.  Your physician recommends that you schedule a follow-up appointment in: as needed  

## 2015-12-26 NOTE — Telephone Encounter (Signed)
Patient informed during office visit today. 

## 2015-12-26 NOTE — Progress Notes (Signed)
Cardiology Office Note  Date: 12/26/2015   ID: Mario Jones, DOB 27-Feb-1971, MRN [id number]  PCP: Selinda Flavin, MD  Primary Cardiologist: Nona Dell, MD   Chief Complaint  Patient presents with  . Follow-up cardiac catheterization    History of Present Illness: Mario Jones is a 45 y.o. male seen recently in late March and referred for a diagnostic cardiac catheterization to evaluate possible angina symptoms. Fortunately, cardiac catheterization by Dr. 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 He had also undergone an echocardiogram with suspicion of possible ascending aortic dissection flap time at time of reading by Dr. Purvis Sheffield. Ultimately however follow-up CT angiogram of the chest showed normal aorta and no other acute findings.  He comes in today with his wife. We discussed the results of his recent testing which are overall reassuring. No definite acute cardiac process defined that would explain his symptoms. He still reports dyspnea on exertion, sometimes a discomfort on the left side of his neck. I have asked him to follow-up with Dr. Dimas Aguas to continue to evaluate the symptoms for other potential etiologies.  Past Medical History  Diagnosis Date  . GERD (gastroesophageal reflux disease)   . Seasonal allergies   . Hyperlipidemia   . Osteoarthritis   . Statin intolerance   . History of cardiac catheterization     Normal coronaries April 2017    Current Outpatient Prescriptions  Medication Sig Dispense Refill  . HYDROcodone-acetaminophen (NORCO) 10-325 MG per tablet Take 1 tablet by mouth as needed for moderate pain. For back pain    . ibuprofen (ADVIL,MOTRIN) 200 MG tablet Take 400-600 mg by mouth daily as needed for moderate pain.    Marland Kitchen loratadine (CLARITIN) 10 MG tablet Take 10 mg by mouth daily.    . pantoprazole (PROTONIX) 40 MG tablet Take 40 mg by mouth every morning.     .  ranitidine (ZANTAC) 150 MG capsule Take 150 mg by mouth every evening.     . traMADol (ULTRAM) 50 MG tablet Take 50 mg by mouth every 6 (six) hours as needed for moderate pain.   0  . traZODone (DESYREL) 100 MG tablet Take 0.5 tablets by mouth at bedtime.  0   No current facility-administered medications for this visit.   Allergies:  Morphine and related   Social History: The patient  reports that he has never smoked. He quit smokeless tobacco use about 26 years ago. His smokeless tobacco use included Chew. He reports that he does not drink alcohol or use illicit drugs.   ROS:  Please see the history of present illness. Otherwise, complete review of systems is positive for fatigue, dyspnea on exertion, intermittent left neck pain.  All other systems are reviewed and negative.   Physical Exam: VS:  BP 123/87 mmHg  Pulse 78  Ht 6' (1.829 m)  Wt 210 lb 3.2 oz (95.346 kg)  BMI 28.50 kg/m2  SpO2 97%, BMI Body mass index is 28.5 kg/(m^2).  Wt Readings from Last 3 Encounters:  12/26/15 210 lb 3.2 oz (95.346 kg)  12/22/15 195 lb (88.451 kg)  12/16/15 207 lb (93.895 kg)    General: Patient appears comfortable at rest. HEENT: Conjunctiva and lids normal, oropharynx clear. Neck: Supple, no elevated JVP or carotid bruits, no thyromegaly. Lungs: Clear to auscultation, nonlabored breathing at rest. Cardiac: Regular rate and rhythm, no S3 or significant systolic murmur, no pericardial rub. Abdomen: Soft, nontender, no hepatomegaly,  bowel sounds present, no guarding or rebound. Extremities: No pitting edema, distal pulses 2+.  ECG: I personally reviewed the prior tracing from 12/16/2015 which showed normal sinus rhythm.  Recent Labwork: 12/22/2015: BUN 11; Creatinine, Ser 0.86; Hemoglobin 15.3; Platelets 277; Potassium 4.1; Sodium 140   Other Studies Reviewed Today:  Cardiac catheterization 12/22/2015: Coronary Findings    Dominance: Right   Left Main  . Vessel is angiographically normal.      Left Anterior Descending  The LAD is angiographically normal. The mid segment of the LAD demonstrates intramyocardial bridging. There are no obstructive lesions identified.     Ramus Intermedius  The intermediate is a large branch with no obstruction.     Left Circumflex  The circumflex is patent with no obstructive disease.     Right Coronary Artery  . Vessel is angiographically normal.   . Right Posterior Descending Artery   The vessel is angiographically normal.     Echocardiogram 12/21/2015: Study Conclusions  - Left ventricle: The cavity size was normal. Wall thickness was  normal. Systolic function was normal. The estimated ejection  fraction was in the range of 60% to 65%. There was an increased  relative contribution of atrial contraction to ventricular  filling. - Left atrium: The atrium was mildly dilated.  Impressions:  - There is a region of the thoracic aorta that may contain a  dissection flap, with visible flow in the flap itself. Results  communicated with primary cardiologist. A CT angiogram of the  chest has been ordered.  Chest CT angiogram 12/21/2015: FINDINGS: The lungs are well aerated bilaterally. No focal infiltrate, effusion or pneumothorax is seen. No parenchymal nodules are seen.  The thoracic inlet is within normal limits. The thoracic aorta and its branches are unremarkable. Mild cardiac motion artifact is seen although no definitive dissection flap or aneurysmal dilatation is seen. The pulmonary artery as visualized shows no acute abnormality although timing was mainly for arterial evaluation. No hilar or mediastinal adenopathy is seen.  Fatty infiltration of the liver is seen. The gallbladder has been surgically removed. Remainder the upper abdomen is unremarkable. The osseous structures show no acute abnormality.  Review of the MIP images confirms the above findings.  IMPRESSION: No evidence of aortic  dissection.  Chronic changes as described above.  Assessment and Plan:  1. Patient presented with symptoms initially concerning for exertional angina. Subsequent cardiac workup however has been reassuring. He has normal LVEF, no evidence of aortic dissection by chest CT imaging (done based on echocardiographic findings), and normal coronary arteries at cardiac catheterization. 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 I have recommended that he keep close follow-up with Dr. Dimas AguasHoward to investigate symptoms further and consider other etiologies. With left-sided neck pain, it would be worth considering further imaging studies, possibly even CT angiogram or MRI. Although obstructive carotid artery disease is not highly suspected and would generally not cause pain, carotid dissection could be evaluated this way.  2. Hyperlipidemia with history of statin intolerance.  Current medicines were reviewed with the patient today.  Disposition: FU with me as needed.   Signed, Jonelle SidleSamuel G. Ayat Drenning, MD, [facility] 12/26/2015 2:50 PM    [facility] [place] [address], [place], [place] [address] Phone: [phone]; Fax: [phone]

## 2015-12-28 DIAGNOSIS — Z6828 Body mass index (BMI) 28.0-28.9, adult: Secondary | ICD-10-CM | POA: Diagnosis not present

## 2015-12-28 DIAGNOSIS — M791 Myalgia: Secondary | ICD-10-CM | POA: Diagnosis not present

## 2016-01-09 DIAGNOSIS — S83282D Other tear of lateral meniscus, current injury, left knee, subsequent encounter: Secondary | ICD-10-CM | POA: Diagnosis not present

## 2016-01-09 DIAGNOSIS — S82242D Displaced spiral fracture of shaft of left tibia, subsequent encounter for closed fracture with routine healing: Secondary | ICD-10-CM | POA: Diagnosis not present

## 2016-01-09 DIAGNOSIS — T8489XD Other specified complication of internal orthopedic prosthetic devices, implants and grafts, subsequent encounter: Secondary | ICD-10-CM | POA: Diagnosis not present

## 2016-01-23 DIAGNOSIS — S83282D Other tear of lateral meniscus, current injury, left knee, subsequent encounter: Secondary | ICD-10-CM | POA: Diagnosis not present

## 2016-03-15 DIAGNOSIS — M238X2 Other internal derangements of left knee: Secondary | ICD-10-CM | POA: Diagnosis not present

## 2016-03-15 DIAGNOSIS — M4716 Other spondylosis with myelopathy, lumbar region: Secondary | ICD-10-CM | POA: Diagnosis not present

## 2016-05-07 DIAGNOSIS — R21 Rash and other nonspecific skin eruption: Secondary | ICD-10-CM | POA: Diagnosis not present

## 2016-05-07 DIAGNOSIS — Z6828 Body mass index (BMI) 28.0-28.9, adult: Secondary | ICD-10-CM | POA: Diagnosis not present

## 2016-05-09 DIAGNOSIS — S70362A Insect bite (nonvenomous), left thigh, initial encounter: Secondary | ICD-10-CM | POA: Diagnosis not present

## 2016-05-09 DIAGNOSIS — Z6828 Body mass index (BMI) 28.0-28.9, adult: Secondary | ICD-10-CM | POA: Diagnosis not present

## 2016-05-09 DIAGNOSIS — R21 Rash and other nonspecific skin eruption: Secondary | ICD-10-CM | POA: Diagnosis not present

## 2016-05-09 DIAGNOSIS — L282 Other prurigo: Secondary | ICD-10-CM | POA: Diagnosis not present

## 2016-05-09 DIAGNOSIS — M2550 Pain in unspecified joint: Secondary | ICD-10-CM | POA: Diagnosis not present

## 2016-06-12 DIAGNOSIS — M4712 Other spondylosis with myelopathy, cervical region: Secondary | ICD-10-CM | POA: Diagnosis not present

## 2016-06-12 DIAGNOSIS — M4716 Other spondylosis with myelopathy, lumbar region: Secondary | ICD-10-CM | POA: Diagnosis not present

## 2016-07-19 DIAGNOSIS — Z0000 Encounter for general adult medical examination without abnormal findings: Secondary | ICD-10-CM | POA: Diagnosis not present

## 2016-07-26 DIAGNOSIS — Z6828 Body mass index (BMI) 28.0-28.9, adult: Secondary | ICD-10-CM | POA: Diagnosis not present

## 2016-07-26 DIAGNOSIS — Z0000 Encounter for general adult medical examination without abnormal findings: Secondary | ICD-10-CM | POA: Diagnosis not present

## 2016-07-26 DIAGNOSIS — Z23 Encounter for immunization: Secondary | ICD-10-CM | POA: Diagnosis not present

## 2016-08-14 DIAGNOSIS — Z6828 Body mass index (BMI) 28.0-28.9, adult: Secondary | ICD-10-CM | POA: Diagnosis not present

## 2016-08-14 DIAGNOSIS — J069 Acute upper respiratory infection, unspecified: Secondary | ICD-10-CM | POA: Diagnosis not present

## 2016-08-14 DIAGNOSIS — G4700 Insomnia, unspecified: Secondary | ICD-10-CM | POA: Diagnosis not present

## 2016-08-14 DIAGNOSIS — J019 Acute sinusitis, unspecified: Secondary | ICD-10-CM | POA: Diagnosis not present

## 2016-09-05 DIAGNOSIS — M4716 Other spondylosis with myelopathy, lumbar region: Secondary | ICD-10-CM | POA: Diagnosis not present

## 2016-09-05 DIAGNOSIS — M4712 Other spondylosis with myelopathy, cervical region: Secondary | ICD-10-CM | POA: Diagnosis not present

## 2016-11-20 DIAGNOSIS — M4716 Other spondylosis with myelopathy, lumbar region: Secondary | ICD-10-CM | POA: Diagnosis not present

## 2016-11-20 DIAGNOSIS — M4712 Other spondylosis with myelopathy, cervical region: Secondary | ICD-10-CM | POA: Diagnosis not present

## 2017-02-19 DIAGNOSIS — M4716 Other spondylosis with myelopathy, lumbar region: Secondary | ICD-10-CM | POA: Diagnosis not present

## 2017-02-19 DIAGNOSIS — M4712 Other spondylosis with myelopathy, cervical region: Secondary | ICD-10-CM | POA: Diagnosis not present

## 2017-05-28 DIAGNOSIS — Z6829 Body mass index (BMI) 29.0-29.9, adult: Secondary | ICD-10-CM | POA: Diagnosis not present

## 2017-05-28 DIAGNOSIS — M4716 Other spondylosis with myelopathy, lumbar region: Secondary | ICD-10-CM | POA: Diagnosis not present

## 2017-06-13 DIAGNOSIS — Z23 Encounter for immunization: Secondary | ICD-10-CM | POA: Diagnosis not present

## 2017-06-19 IMAGING — CT CT ANGIO CHEST
1 of 8 series · 18 of 46 positions shown · IV contrast (Omnipaque 300)
Comparison: By report from recent echocardiography today

CLINICAL DATA: Possible dissection on recent echo

EXAM:
CT ANGIOGRAPHY CHEST WITH CONTRAST
TECHNIQUE: Multidetector CT imaging of the chest was performed using the
standard protocol during bolus administration of intravenous
contrast. Multiplanar CT image reconstructions and MIPs were
obtained to evaluate the vascular anatomy.
CONTRAST:  100mL OMNIPAQUE IOHEXOL 350 MG/ML SOLN

[Series 5: dissection 3.0 b40f · axial · 0.75mm/px · z∈[+772,+1128]mm · 18 of 135 slices shown]
[im 8/135  lung]
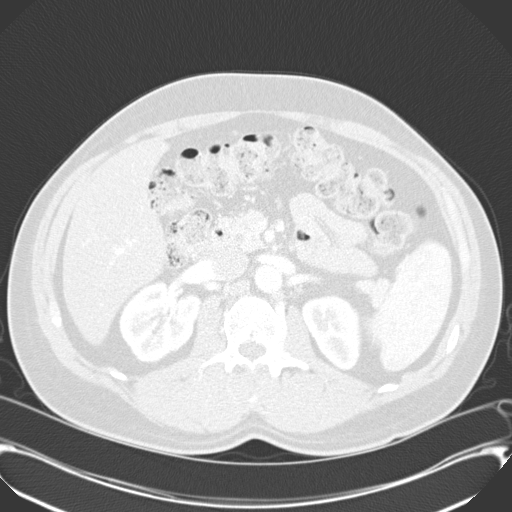
[im 15/135  soft-tissue]
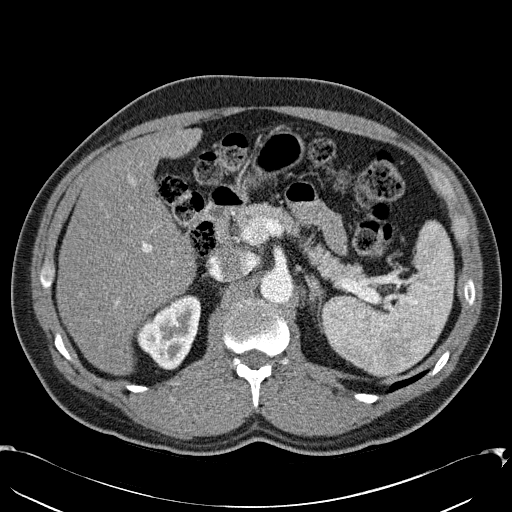
[im 22/135  lung]
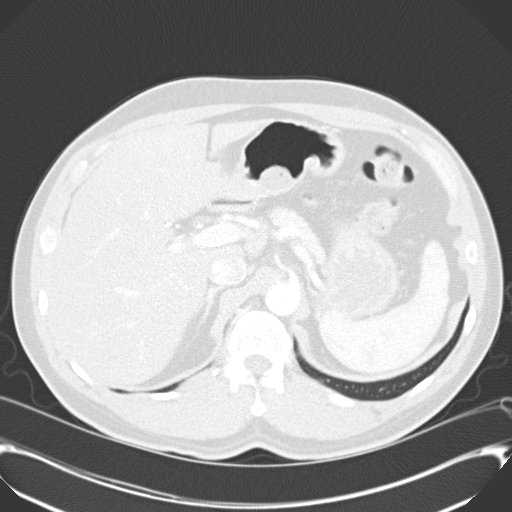
[im 29/135  soft-tissue]
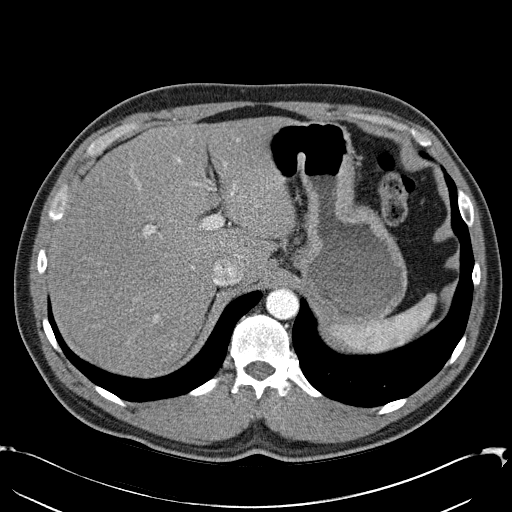
[im 36/135  lung]
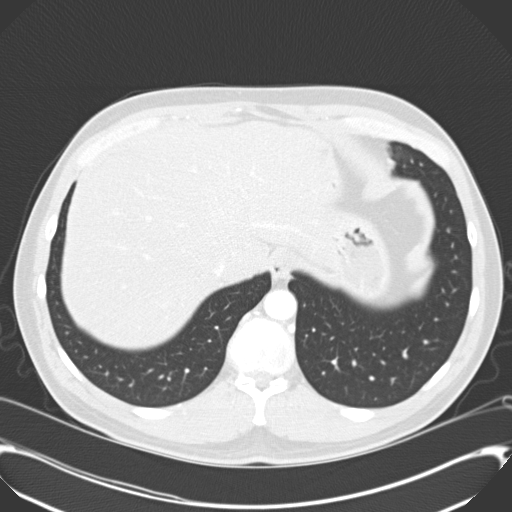
[im 43/135  soft-tissue]
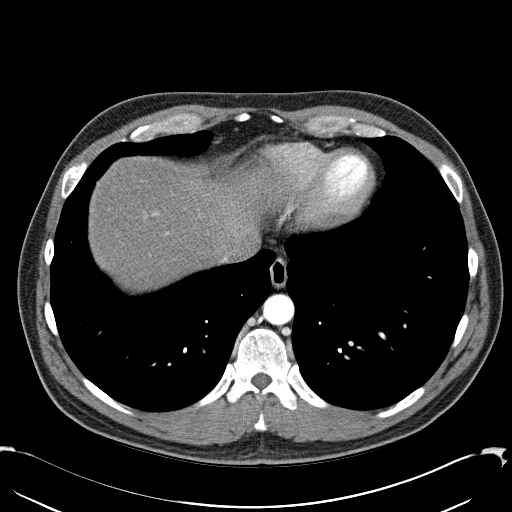
[im 50/135  lung]
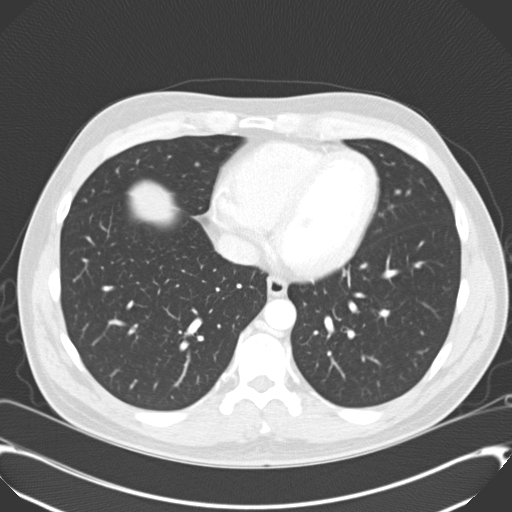
[im 57/135  soft-tissue]
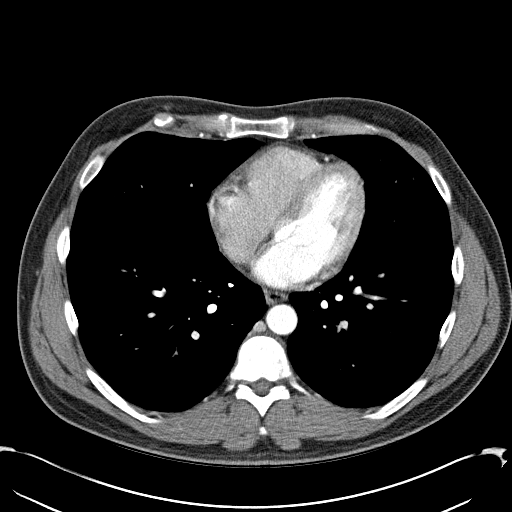
[im 64/135  lung]
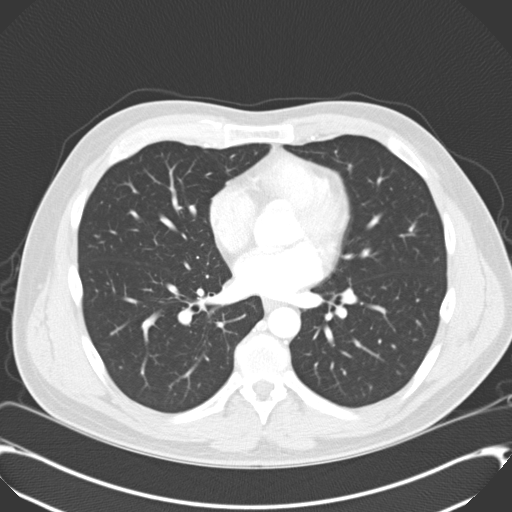
[im 71/135  soft-tissue]
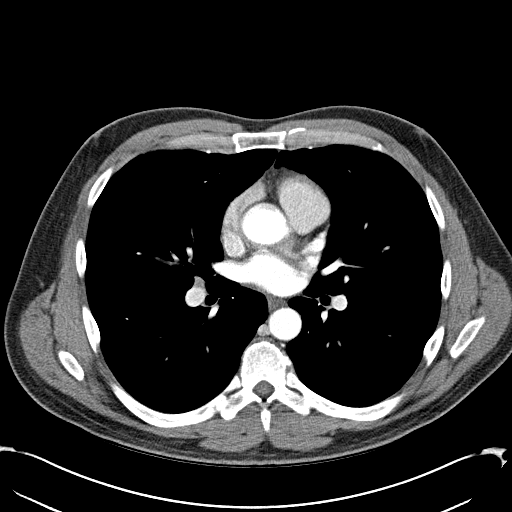
[im 78/135  lung]
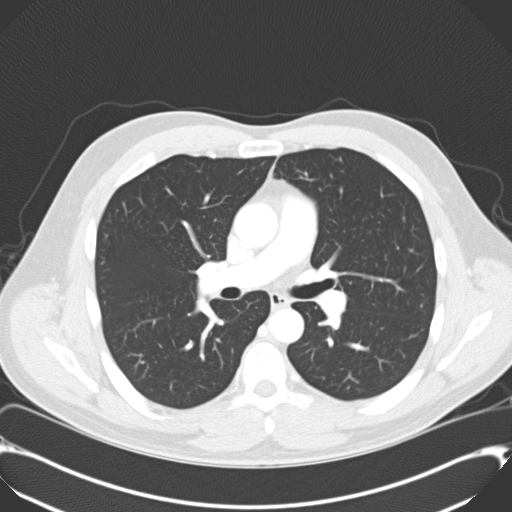
[im 85/135  soft-tissue]
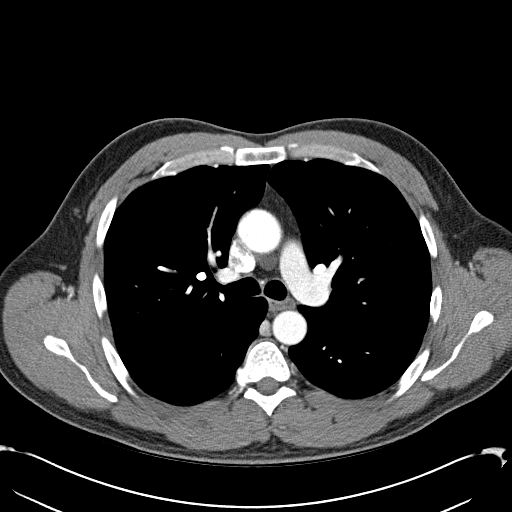
[im 92/135  lung]
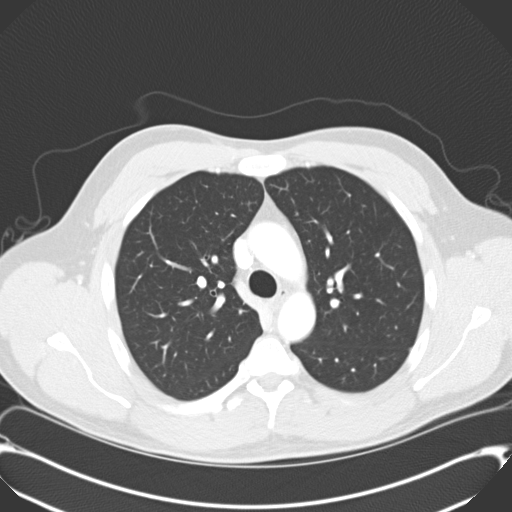
[im 99/135  soft-tissue]
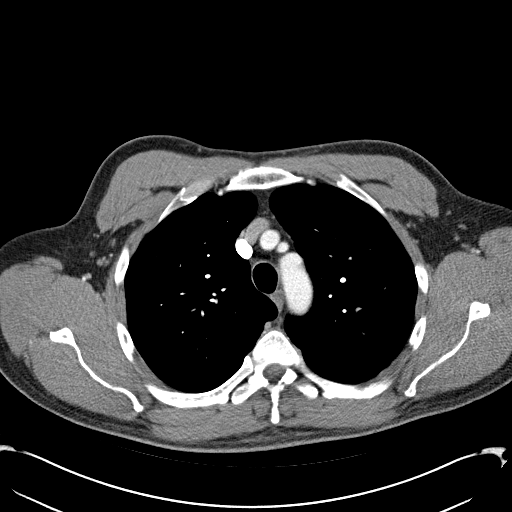
[im 106/135  lung]
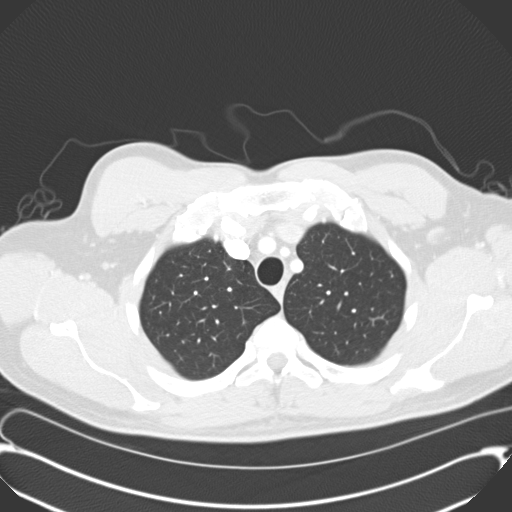
[im 113/135  soft-tissue]
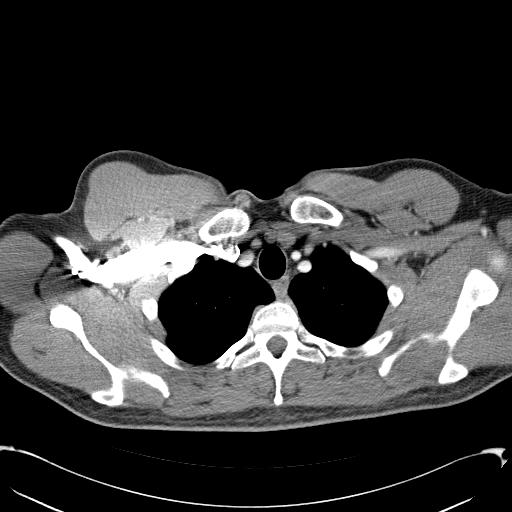
[im 120/135  lung]
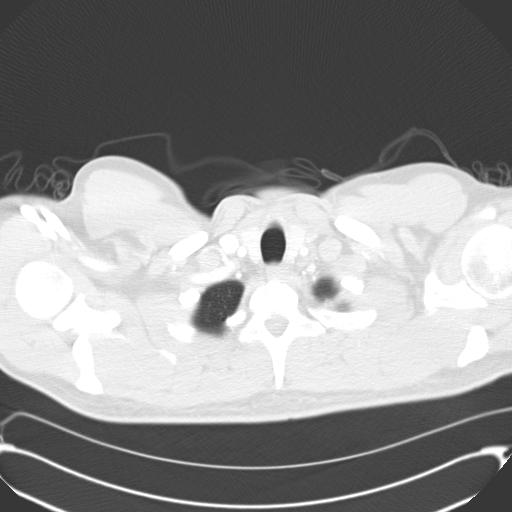
[im 127/135  soft-tissue]
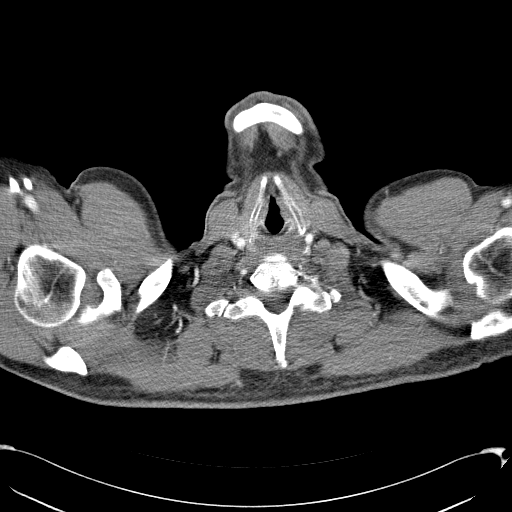

[18 of 46 positions shown; findings below may reference images not displayed]

FINDINGS: The lungs are well aerated bilaterally. No focal infiltrate,
effusion or pneumothorax is seen. No parenchymal nodules are seen.

The thoracic inlet is within normal limits. The thoracic aorta and
its branches are unremarkable. Mild cardiac motion artifact is seen
although no definitive dissection flap or aneurysmal dilatation is
seen. The pulmonary artery as visualized shows no acute abnormality
although timing was mainly for arterial evaluation. No hilar or
mediastinal adenopathy is seen.

Fatty infiltration of the liver is seen. The gallbladder has been
surgically removed. Remainder the upper abdomen is unremarkable. The
osseous structures show no acute abnormality.

Review of the MIP images confirms the above findings.
IMPRESSION: No evidence of aortic dissection.

Chronic changes as described above.

These results will be called to the ordering clinician or
representative by the Radiologist Assistant, and communication
documented in the PACS or zVision Dashboard.

## 2017-07-02 DIAGNOSIS — H00025 Hordeolum internum left lower eyelid: Secondary | ICD-10-CM | POA: Diagnosis not present

## 2017-08-29 DIAGNOSIS — H00011 Hordeolum externum right upper eyelid: Secondary | ICD-10-CM | POA: Diagnosis not present

## 2017-09-06 DIAGNOSIS — M47816 Spondylosis without myelopathy or radiculopathy, lumbar region: Secondary | ICD-10-CM | POA: Diagnosis not present

## 2017-09-06 DIAGNOSIS — Z6830 Body mass index (BMI) 30.0-30.9, adult: Secondary | ICD-10-CM | POA: Diagnosis not present

## 2017-10-10 DIAGNOSIS — E782 Mixed hyperlipidemia: Secondary | ICD-10-CM | POA: Diagnosis not present

## 2017-10-10 DIAGNOSIS — R5383 Other fatigue: Secondary | ICD-10-CM | POA: Diagnosis not present

## 2017-10-10 DIAGNOSIS — M1990 Unspecified osteoarthritis, unspecified site: Secondary | ICD-10-CM | POA: Diagnosis not present

## 2017-10-10 DIAGNOSIS — K219 Gastro-esophageal reflux disease without esophagitis: Secondary | ICD-10-CM | POA: Diagnosis not present

## 2017-10-22 DIAGNOSIS — Z6830 Body mass index (BMI) 30.0-30.9, adult: Secondary | ICD-10-CM | POA: Diagnosis not present

## 2017-10-22 DIAGNOSIS — Z0000 Encounter for general adult medical examination without abnormal findings: Secondary | ICD-10-CM | POA: Diagnosis not present

## 2017-11-25 DIAGNOSIS — M4716 Other spondylosis with myelopathy, lumbar region: Secondary | ICD-10-CM | POA: Diagnosis not present

## 2017-11-25 DIAGNOSIS — Z6830 Body mass index (BMI) 30.0-30.9, adult: Secondary | ICD-10-CM | POA: Diagnosis not present

## 2018-01-27 DIAGNOSIS — Z6830 Body mass index (BMI) 30.0-30.9, adult: Secondary | ICD-10-CM | POA: Diagnosis not present

## 2018-01-27 DIAGNOSIS — S83282A Other tear of lateral meniscus, current injury, left knee, initial encounter: Secondary | ICD-10-CM | POA: Diagnosis not present

## 2018-01-27 DIAGNOSIS — M238X2 Other internal derangements of left knee: Secondary | ICD-10-CM | POA: Diagnosis not present

## 2018-02-14 DIAGNOSIS — H00011 Hordeolum externum right upper eyelid: Secondary | ICD-10-CM | POA: Diagnosis not present

## 2018-02-28 DIAGNOSIS — M47816 Spondylosis without myelopathy or radiculopathy, lumbar region: Secondary | ICD-10-CM | POA: Diagnosis not present

## 2018-02-28 DIAGNOSIS — Z6829 Body mass index (BMI) 29.0-29.9, adult: Secondary | ICD-10-CM | POA: Diagnosis not present

## 2018-06-13 DIAGNOSIS — M4716 Other spondylosis with myelopathy, lumbar region: Secondary | ICD-10-CM | POA: Diagnosis not present

## 2018-06-13 DIAGNOSIS — Z6829 Body mass index (BMI) 29.0-29.9, adult: Secondary | ICD-10-CM | POA: Diagnosis not present

## 2018-06-23 DIAGNOSIS — Z23 Encounter for immunization: Secondary | ICD-10-CM | POA: Diagnosis not present

## 2018-08-13 DIAGNOSIS — Z6829 Body mass index (BMI) 29.0-29.9, adult: Secondary | ICD-10-CM | POA: Diagnosis not present

## 2018-08-13 DIAGNOSIS — S83282A Other tear of lateral meniscus, current injury, left knee, initial encounter: Secondary | ICD-10-CM | POA: Diagnosis not present

## 2018-09-05 DIAGNOSIS — S83512A Sprain of anterior cruciate ligament of left knee, initial encounter: Secondary | ICD-10-CM | POA: Diagnosis not present

## 2018-09-05 DIAGNOSIS — S83282A Other tear of lateral meniscus, current injury, left knee, initial encounter: Secondary | ICD-10-CM | POA: Diagnosis not present

## 2018-09-05 DIAGNOSIS — G8929 Other chronic pain: Secondary | ICD-10-CM | POA: Diagnosis not present

## 2018-09-05 DIAGNOSIS — Z886 Allergy status to analgesic agent status: Secondary | ICD-10-CM | POA: Diagnosis not present

## 2018-09-05 DIAGNOSIS — X58XXXA Exposure to other specified factors, initial encounter: Secondary | ICD-10-CM | POA: Diagnosis not present

## 2018-09-05 DIAGNOSIS — K219 Gastro-esophageal reflux disease without esophagitis: Secondary | ICD-10-CM | POA: Diagnosis not present

## 2018-09-05 DIAGNOSIS — Z8489 Family history of other specified conditions: Secondary | ICD-10-CM | POA: Diagnosis not present

## 2018-09-05 DIAGNOSIS — M25562 Pain in left knee: Secondary | ICD-10-CM | POA: Diagnosis not present

## 2018-09-05 DIAGNOSIS — S83242A Other tear of medial meniscus, current injury, left knee, initial encounter: Secondary | ICD-10-CM | POA: Diagnosis not present

## 2018-09-05 DIAGNOSIS — Z888 Allergy status to other drugs, medicaments and biological substances status: Secondary | ICD-10-CM | POA: Diagnosis not present

## 2018-09-05 DIAGNOSIS — Z79899 Other long term (current) drug therapy: Secondary | ICD-10-CM | POA: Diagnosis not present

## 2018-09-05 DIAGNOSIS — Z9049 Acquired absence of other specified parts of digestive tract: Secondary | ICD-10-CM | POA: Diagnosis not present

## 2018-09-05 DIAGNOSIS — M23304 Other meniscus derangements, unspecified medial meniscus, left knee: Secondary | ICD-10-CM | POA: Diagnosis not present

## 2018-09-05 DIAGNOSIS — M1990 Unspecified osteoarthritis, unspecified site: Secondary | ICD-10-CM | POA: Diagnosis not present

## 2018-09-12 DIAGNOSIS — M4716 Other spondylosis with myelopathy, lumbar region: Secondary | ICD-10-CM | POA: Diagnosis not present

## 2018-09-12 DIAGNOSIS — Z6828 Body mass index (BMI) 28.0-28.9, adult: Secondary | ICD-10-CM | POA: Diagnosis not present

## 2018-09-29 DIAGNOSIS — H5310 Unspecified subjective visual disturbances: Secondary | ICD-10-CM | POA: Diagnosis not present

## 2018-09-29 DIAGNOSIS — G43109 Migraine with aura, not intractable, without status migrainosus: Secondary | ICD-10-CM | POA: Diagnosis not present

## 2018-09-29 DIAGNOSIS — H0011 Chalazion right upper eyelid: Secondary | ICD-10-CM | POA: Diagnosis not present

## 2018-09-29 DIAGNOSIS — H43391 Other vitreous opacities, right eye: Secondary | ICD-10-CM | POA: Diagnosis not present

## 2018-09-29 DIAGNOSIS — H01009 Unspecified blepharitis unspecified eye, unspecified eyelid: Secondary | ICD-10-CM | POA: Diagnosis not present

## 2018-10-02 DIAGNOSIS — H04123 Dry eye syndrome of bilateral lacrimal glands: Secondary | ICD-10-CM | POA: Diagnosis not present

## 2018-10-24 DIAGNOSIS — H5310 Unspecified subjective visual disturbances: Secondary | ICD-10-CM | POA: Diagnosis not present

## 2018-10-24 DIAGNOSIS — H0011 Chalazion right upper eyelid: Secondary | ICD-10-CM | POA: Diagnosis not present

## 2018-10-24 DIAGNOSIS — H01009 Unspecified blepharitis unspecified eye, unspecified eyelid: Secondary | ICD-10-CM | POA: Diagnosis not present

## 2018-12-05 DIAGNOSIS — M47816 Spondylosis without myelopathy or radiculopathy, lumbar region: Secondary | ICD-10-CM | POA: Diagnosis not present

## 2018-12-05 DIAGNOSIS — Z6828 Body mass index (BMI) 28.0-28.9, adult: Secondary | ICD-10-CM | POA: Diagnosis not present

## 2018-12-05 DIAGNOSIS — M4716 Other spondylosis with myelopathy, lumbar region: Secondary | ICD-10-CM | POA: Diagnosis not present

## 2018-12-19 DIAGNOSIS — M549 Dorsalgia, unspecified: Secondary | ICD-10-CM | POA: Diagnosis not present

## 2019-02-06 DIAGNOSIS — Z6828 Body mass index (BMI) 28.0-28.9, adult: Secondary | ICD-10-CM | POA: Diagnosis not present

## 2019-02-06 DIAGNOSIS — M5412 Radiculopathy, cervical region: Secondary | ICD-10-CM | POA: Diagnosis not present

## 2019-02-06 DIAGNOSIS — R030 Elevated blood-pressure reading, without diagnosis of hypertension: Secondary | ICD-10-CM | POA: Diagnosis not present

## 2019-02-06 DIAGNOSIS — S8411XA Injury of peroneal nerve at lower leg level, right leg, initial encounter: Secondary | ICD-10-CM | POA: Diagnosis not present

## 2019-02-12 ENCOUNTER — Other Ambulatory Visit: Payer: Self-pay | Admitting: Neurological Surgery

## 2019-02-12 DIAGNOSIS — M5412 Radiculopathy, cervical region: Secondary | ICD-10-CM

## 2019-03-14 ENCOUNTER — Ambulatory Visit
Admission: RE | Admit: 2019-03-14 | Discharge: 2019-03-14 | Disposition: A | Discharge status: Home / Self Care | Payer: BLUE CROSS/BLUE SHIELD | Source: Ambulatory Visit | Attending: Neurological Surgery | Admitting: Neurological Surgery

## 2019-03-14 ENCOUNTER — Other Ambulatory Visit: Payer: Self-pay

## 2019-03-14 DIAGNOSIS — M5412 Radiculopathy, cervical region: Secondary | ICD-10-CM

## 2019-03-14 DIAGNOSIS — M50123 Cervical disc disorder at C6-C7 level with radiculopathy: Secondary | ICD-10-CM | POA: Diagnosis not present

## 2019-03-26 DIAGNOSIS — M5412 Radiculopathy, cervical region: Secondary | ICD-10-CM | POA: Diagnosis not present

## 2019-04-16 DIAGNOSIS — Z6828 Body mass index (BMI) 28.0-28.9, adult: Secondary | ICD-10-CM | POA: Diagnosis not present

## 2019-04-16 DIAGNOSIS — M542 Cervicalgia: Secondary | ICD-10-CM | POA: Diagnosis not present

## 2019-04-16 DIAGNOSIS — K219 Gastro-esophageal reflux disease without esophagitis: Secondary | ICD-10-CM | POA: Diagnosis not present

## 2019-04-16 DIAGNOSIS — G9001 Carotid sinus syncope: Secondary | ICD-10-CM | POA: Diagnosis not present

## 2019-05-12 DIAGNOSIS — R208 Other disturbances of skin sensation: Secondary | ICD-10-CM | POA: Diagnosis not present

## 2019-05-12 DIAGNOSIS — M5414 Radiculopathy, thoracic region: Secondary | ICD-10-CM | POA: Diagnosis not present

## 2019-05-12 DIAGNOSIS — Z6827 Body mass index (BMI) 27.0-27.9, adult: Secondary | ICD-10-CM | POA: Diagnosis not present

## 2019-05-12 DIAGNOSIS — R292 Abnormal reflex: Secondary | ICD-10-CM | POA: Diagnosis not present

## 2019-05-18 ENCOUNTER — Ambulatory Visit (INDEPENDENT_AMBULATORY_CARE_PROVIDER_SITE_OTHER): Payer: BC Managed Care – PPO | Admitting: Otolaryngology

## 2019-05-18 DIAGNOSIS — R070 Pain in throat: Secondary | ICD-10-CM | POA: Diagnosis not present

## 2019-05-18 DIAGNOSIS — H9209 Otalgia, unspecified ear: Secondary | ICD-10-CM | POA: Diagnosis not present

## 2019-05-20 ENCOUNTER — Other Ambulatory Visit: Payer: Self-pay | Admitting: Physician Assistant

## 2019-05-20 DIAGNOSIS — R292 Abnormal reflex: Secondary | ICD-10-CM

## 2019-05-20 DIAGNOSIS — M5414 Radiculopathy, thoracic region: Secondary | ICD-10-CM

## 2019-06-01 DIAGNOSIS — Z23 Encounter for immunization: Secondary | ICD-10-CM | POA: Diagnosis not present

## 2019-06-13 ENCOUNTER — Other Ambulatory Visit: Payer: Self-pay

## 2019-06-13 ENCOUNTER — Ambulatory Visit
Admission: RE | Admit: 2019-06-13 | Discharge: 2019-06-13 | Disposition: A | Discharge status: Home / Self Care | Payer: BC Managed Care – PPO | Source: Ambulatory Visit | Attending: Physician Assistant | Admitting: Physician Assistant

## 2019-06-13 DIAGNOSIS — M5414 Radiculopathy, thoracic region: Secondary | ICD-10-CM

## 2019-06-13 DIAGNOSIS — M5114 Intervertebral disc disorders with radiculopathy, thoracic region: Secondary | ICD-10-CM | POA: Diagnosis not present

## 2019-06-13 DIAGNOSIS — M4724 Other spondylosis with radiculopathy, thoracic region: Secondary | ICD-10-CM | POA: Diagnosis not present

## 2019-06-13 DIAGNOSIS — M4804 Spinal stenosis, thoracic region: Secondary | ICD-10-CM | POA: Diagnosis not present

## 2019-06-13 DIAGNOSIS — R292 Abnormal reflex: Secondary | ICD-10-CM

## 2019-07-01 DIAGNOSIS — M5136 Other intervertebral disc degeneration, lumbar region: Secondary | ICD-10-CM | POA: Diagnosis not present

## 2019-07-01 DIAGNOSIS — Z5181 Encounter for therapeutic drug level monitoring: Secondary | ICD-10-CM | POA: Diagnosis not present

## 2019-07-01 DIAGNOSIS — M5030 Other cervical disc degeneration, unspecified cervical region: Secondary | ICD-10-CM | POA: Diagnosis not present

## 2019-07-01 DIAGNOSIS — M4722 Other spondylosis with radiculopathy, cervical region: Secondary | ICD-10-CM | POA: Diagnosis not present

## 2019-07-02 DIAGNOSIS — M4726 Other spondylosis with radiculopathy, lumbar region: Secondary | ICD-10-CM | POA: Diagnosis not present

## 2019-07-02 DIAGNOSIS — Z79891 Long term (current) use of opiate analgesic: Secondary | ICD-10-CM | POA: Diagnosis not present

## 2019-07-02 DIAGNOSIS — Z5181 Encounter for therapeutic drug level monitoring: Secondary | ICD-10-CM | POA: Diagnosis not present

## 2019-07-20 DIAGNOSIS — Z79899 Other long term (current) drug therapy: Secondary | ICD-10-CM | POA: Diagnosis not present

## 2019-07-20 DIAGNOSIS — F1120 Opioid dependence, uncomplicated: Secondary | ICD-10-CM | POA: Diagnosis not present

## 2019-07-20 DIAGNOSIS — M5412 Radiculopathy, cervical region: Secondary | ICD-10-CM | POA: Diagnosis not present

## 2019-07-20 DIAGNOSIS — M5020 Other cervical disc displacement, unspecified cervical region: Secondary | ICD-10-CM | POA: Diagnosis not present

## 2019-07-24 DIAGNOSIS — G950 Syringomyelia and syringobulbia: Secondary | ICD-10-CM | POA: Diagnosis not present

## 2019-09-07 ENCOUNTER — Encounter: Payer: Self-pay | Admitting: Gastroenterology

## 2019-09-07 DIAGNOSIS — Z0000 Encounter for general adult medical examination without abnormal findings: Secondary | ICD-10-CM | POA: Diagnosis not present

## 2019-09-09 DIAGNOSIS — Z0000 Encounter for general adult medical examination without abnormal findings: Secondary | ICD-10-CM | POA: Diagnosis not present

## 2020-03-31 ENCOUNTER — Encounter (INDEPENDENT_AMBULATORY_CARE_PROVIDER_SITE_OTHER): Payer: Self-pay | Admitting: Gastroenterology

## 2020-03-31 ENCOUNTER — Other Ambulatory Visit: Payer: Self-pay

## 2020-03-31 ENCOUNTER — Ambulatory Visit (INDEPENDENT_AMBULATORY_CARE_PROVIDER_SITE_OTHER): Payer: BC Managed Care – PPO | Admitting: Gastroenterology

## 2020-03-31 VITALS — BP 156/96 | HR 82 | Temp 98.2°F | Ht 72.0 in | Wt 222.9 lb

## 2020-03-31 DIAGNOSIS — R1011 Right upper quadrant pain: Secondary | ICD-10-CM

## 2020-03-31 DIAGNOSIS — K219 Gastro-esophageal reflux disease without esophagitis: Secondary | ICD-10-CM

## 2020-03-31 DIAGNOSIS — R197 Diarrhea, unspecified: Secondary | ICD-10-CM

## 2020-03-31 MED ORDER — SUCRALFATE 1 G PO TABS: 1.0000 g | ORAL_TABLET | Freq: Two times a day (BID) | ORAL | 3 refills | Status: DC

## 2020-03-31 NOTE — Patient Instructions (Signed)
We are checking stool studies and repeat lab results.  Try carafate - this coats stomach and can help w/ gastritis or stomach ulcers  Will call w/ results

## 2020-03-31 NOTE — Progress Notes (Signed)
Patient profile: Mario Jones is a 49 y.o. male seen for evaluation of epigastric pain . Last seen in clinic 2015   History of Present Illness: Mario Jones is seen today for evaluation of RUQ pain. He has a hx of sphincter of odi dysfunction and was followed in 2015 by Dr Karilyn Cota. He reports about one episode a year w/ a few minutes of intense pain  Seen today for a chronic RUQ and EG discomfort - notes it began about 6 months ago, he describes the pain as a constant discomfort.  Began immediately after he had multiple courses of Augmentin that he was on for a dental abscess.  He denies significant nausea vomiting with the pain.  Using some Advil most nights of the week.  He denies any dysphagia.  He has no GERD symptoms.  He is maintained on 40 mg of pantoprazole-this helped his esophageal burning but did not help the ruq and epigastric pain. Eating seems to make discomfort worse.  He also is having some bowel changes since the Augmentin course.  He is typically having 1 loose stool a day. If uses imodium will go every other day. His prior baseline is formed stools.  He denies any lower abdominal pain.  No alternating constipation.  Denies blood in stool.  He tried a gluten-free diet without improvement.  He is a non-smoker.  No alcohol.  Wt Readings from Last 3 Encounters:  03/31/20 222 lb 14.4 oz (101.1 kg)  12/26/15 210 lb 3.2 oz (95.3 kg)  12/22/15 195 lb (88.5 kg)     Endoscopy 2014 normal   Past Medical History:  Past Medical History:  Diagnosis Date  . GERD (gastroesophageal reflux disease)   . History of cardiac catheterization    Normal coronaries April 2017  . Hyperlipidemia   . Osteoarthritis   . Seasonal allergies   . Statin intolerance     Problem List: Patient Active Problem List   Diagnosis Date Noted  . Exertional angina (HCC) 12/22/2015  . Abdominal pain, right upper quadrant 08/09/2014  . GERD (gastroesophageal reflux disease) 08/09/2014  . Chronic neck  and back pain 08/09/2014    Past Surgical History: Past Surgical History:  Procedure Laterality Date  . ARTHROSCOPY KNEE W/ DRILLING    . CARDIAC CATHETERIZATION N/A 12/22/2015   Procedure: Left Heart Cath and Coronary Angiography;  Surgeon: Tonny Bollman, MD;  Location: [facility] INVASIVE CV LAB;  Service: Cardiovascular;  Laterality: N/A;  . CHOLECYSTECTOMY    . COLONOSCOPY    . INGUINAL HERNIA REPAIR    . KNEE ARTHROSCOPY W/ ACL RECONSTRUCTION    . UPPER GASTROINTESTINAL ENDOSCOPY      Allergies: Allergies  Allergen Reactions  . Morphine And Related Nausea And Vomiting      Home Medications:  Current Outpatient Medications:  .  ibuprofen (ADVIL,MOTRIN) 200 MG tablet, Take 400-600 mg by mouth daily as needed for moderate pain., Disp: , Rfl:  .  melatonin 3 MG TABS tablet, Take by mouth., Disp: , Rfl:  .  methocarbamol (ROBAXIN) 750 MG tablet, Take by mouth., Disp: , Rfl:  .  pantoprazole (PROTONIX) 40 MG tablet, Take 40 mg by mouth every morning. , Disp: , Rfl:  .  HYDROcodone-acetaminophen (NORCO) 10-325 MG per tablet, Take 1 tablet by mouth as needed for moderate pain. For back pain (Patient not taking: Reported on 03/31/2020), Disp: , Rfl:  .  loratadine (CLARITIN) 10 MG tablet, Take 10 mg by mouth daily. (Patient not taking:  Reported on 03/31/2020), Disp: , Rfl:  .  ranitidine (ZANTAC) 150 MG capsule, Take 150 mg by mouth every evening.  (Patient not taking: Reported on 03/31/2020), Disp: , Rfl:  .  sucralfate (CARAFATE) 1 g tablet, Take 1 tablet (1 g total) by mouth 2 (two) times daily before a meal., Disp: 60 tablet, Rfl: 3 .  traMADol (ULTRAM) 50 MG tablet, Take 50 mg by mouth every 6 (six) hours as needed for moderate pain.  (Patient not taking: Reported on 03/31/2020), Disp: , Rfl: 0 .  traZODone (DESYREL) 100 MG tablet, Take 0.5 tablets by mouth at bedtime. (Patient not taking: Reported on 03/31/2020), Disp: , Rfl: 0   Family History: family history includes Asthma in his son;  Autoimmune disease in his mother; Healthy in his son; Heart disease in his father and sister; Migraines in his mother.    Social History:   reports that he has never smoked. He quit smokeless tobacco use about 30 years ago.  His smokeless tobacco use included chew. He reports that he does not drink alcohol and does not use drugs.   Review of Systems: Constitutional: Denies weight loss/weight gain  Eyes: No changes in vision. ENT: No oral lesions, sore throat.  GI: see HPI.  Heme/Lymph: No easy bruising.  CV: No chest pain.  GU: No hematuria.  Integumentary: No rashes.  Neuro: No headaches.  Psych: No depression/anxiety.  Endocrine: No heat/cold intolerance.  Allergic/Immunologic: No urticaria.  Resp: No cough, SOB.  Musculoskeletal: No joint swelling.    Physical Examination: BP (!) 156/96 (BP Location: Right Arm, Patient Position: Sitting, Cuff Size: Normal)   Pulse 82   Temp 98.2 F (36.8 C) (Oral)   Ht 6' (1.829 m)   Wt 222 lb 14.4 oz (101.1 kg)   BMI 30.23 kg/m  Gen: NAD, alert and oriented x 4 HEENT: PEERLA, EOMI, Neck: supple, no JVD Chest: CTA bilaterally, no wheezes, crackles, or other adventitious sounds CV: RRR, no m/g/c/r Abd: soft, mild tenderness to palpation epigastric area, ND, +BS in all four quadrants; no HSM, guarding, ridigity, or rebound tenderness Ext: no edema, well perfused with 2+ pulses, Skin: no rash or lesions noted on observed skin Lymph: no noted LAD  Data Reviewed:  Prior imaging studies reviewed.  Assessment/Plan: [name] is a 49 y.o. male seen for evaluation of epigastric pain.  1.  Epigastric pain began after course of Augmentin, also has frequent NSAID use.  Suspect he has some underlying gastritis.  He is on Protonix 40MG , will add carafate to see if will help pain. Dicussed if doesn't improve w/ empiric treatment scheduling endoscopy for evaluation. Check labs for eval.   2. Diarrhea - loose stool after course of augmentin, will  check C diff and stool studies for evaluation. If negative can try probiotic.   Addendum-labs returned with minimally elevated ALT, he reports his ALT being elevated in January.  We will request labs from PCP for comparison.  He does not drink alcohol.   Mario Jones was seen today for new patient (initial visit) and diarrhea.  Diagnoses and all orders for this visit:  Gastroesophageal reflux disease, unspecified whether esophagitis present -     COMPLETE METABOLIC PANEL WITH GFR -     CBC with Differential -     C difficile Toxins A+B W/Rflx -     Gastrointestinal Pathogen Panel PCR -     Lipase  Diarrhea, unspecified type -     COMPLETE METABOLIC PANEL WITH GFR -  CBC with Differential -     C difficile Toxins A+B W/Rflx -     Gastrointestinal Pathogen Panel PCR -     Lipase -     C. difficile GDH and Toxin A/B  RUQ pain -     COMPLETE METABOLIC PANEL WITH GFR -     CBC with Differential -     C difficile Toxins A+B W/Rflx -     Gastrointestinal Pathogen Panel PCR -     Lipase  Other orders -     sucralfate (CARAFATE) 1 g tablet; Take 1 tablet (1 g total) by mouth 2 (two) times daily before a meal. -     Lipase        I personally performed the service, non-incident to. (WP)  Tawni Pummel, [facility]

## 2020-04-01 LAB — LIPASE: Lipase: 20 U/L (ref 7–60)

## 2020-04-01 LAB — CBC WITH DIFFERENTIAL/PLATELET
Absolute Monocytes: 770 cells/uL (ref 200–950)
Basophils Absolute: 39 cells/uL (ref 0–200)
Basophils Relative: 0.5 %
Eosinophils Absolute: 139 cells/uL (ref 15–500)
Eosinophils Relative: 1.8 %
HCT: 47.1 % (ref 38.5–50.0)
Hemoglobin: 16.8 g/dL (ref 13.2–17.1)
Lymphs Abs: 2672 cells/uL (ref 850–3900)
MCH: 33.5 pg — ABNORMAL HIGH (ref 27.0–33.0)
MCHC: 35.7 g/dL (ref 32.0–36.0)
MCV: 94 fL (ref 80.0–100.0)
MPV: 9.2 fL (ref 7.5–12.5)
Monocytes Relative: 10 %
Neutro Abs: 4081 cells/uL (ref 1500–7800)
Neutrophils Relative %: 53 %
Platelets: 349 10*3/uL (ref 140–400)
RBC: 5.01 10*6/uL (ref 4.20–5.80)
RDW: 12.7 % (ref 11.0–15.0)
Total Lymphocyte: 34.7 %
WBC: 7.7 10*3/uL (ref 3.8–10.8)

## 2020-04-01 LAB — COMPLETE METABOLIC PANEL WITH GFR
AG Ratio: 1.9 (calc) (ref 1.0–2.5)
ALT: 52 U/L — ABNORMAL HIGH (ref 9–46)
AST: 25 U/L (ref 10–40)
Albumin: 5 g/dL (ref 3.6–5.1)
Alkaline phosphatase (APISO): 74 U/L (ref 36–130)
BUN: 16 mg/dL (ref 7–25)
CO2: 25 mmol/L (ref 20–32)
Calcium: 10.4 mg/dL — ABNORMAL HIGH (ref 8.6–10.3)
Chloride: 102 mmol/L (ref 98–110)
Creat: 1.09 mg/dL (ref 0.60–1.35)
GFR, Est African American: 93 mL/min/{1.73_m2} (ref >=60)
GFR, Est Non African American: 80 mL/min/{1.73_m2} (ref >=60)
Globulin: 2.7 g/dL (calc) (ref 1.9–3.7)
Glucose, Bld: 82 mg/dL (ref 65–99)
Potassium: 4.7 mmol/L (ref 3.5–5.3)
Sodium: 140 mmol/L (ref 135–146)
Total Bilirubin: 0.9 mg/dL (ref 0.2–1.2)
Total Protein: 7.7 g/dL (ref 6.1–8.1)

## 2020-04-04 DIAGNOSIS — K219 Gastro-esophageal reflux disease without esophagitis: Secondary | ICD-10-CM | POA: Diagnosis not present

## 2020-04-04 DIAGNOSIS — R1011 Right upper quadrant pain: Secondary | ICD-10-CM | POA: Diagnosis not present

## 2020-04-04 DIAGNOSIS — R197 Diarrhea, unspecified: Secondary | ICD-10-CM | POA: Diagnosis not present

## 2020-04-07 LAB — C. DIFFICILE GDH AND TOXIN A/B
GDH ANTIGEN: NOT DETECTED
MICRO NUMBER:: 10722532
SPECIMEN QUALITY:: ADEQUATE
TOXIN A AND B: NOT DETECTED

## 2020-04-07 LAB — GASTROINTESTINAL PATHOGEN PANEL PCR
C. difficile Tox A/B, PCR: NOT DETECTED
Campylobacter, PCR: NOT DETECTED
Cryptosporidium, PCR: NOT DETECTED
E coli (ETEC) LT/ST PCR: NOT DETECTED
E coli (STEC) stx1/stx2, PCR: NOT DETECTED
E coli 0157, PCR: NOT DETECTED
Giardia lamblia, PCR: DETECTED — AB
Norovirus, PCR: DETECTED — AB
Rotavirus A, PCR: NOT DETECTED
Salmonella, PCR: NOT DETECTED
Shigella, PCR: NOT DETECTED

## 2020-04-08 ENCOUNTER — Other Ambulatory Visit (INDEPENDENT_AMBULATORY_CARE_PROVIDER_SITE_OTHER): Payer: Self-pay | Admitting: Gastroenterology

## 2020-04-08 ENCOUNTER — Telehealth (INDEPENDENT_AMBULATORY_CARE_PROVIDER_SITE_OTHER): Payer: Self-pay | Admitting: Gastroenterology

## 2020-04-08 MED ORDER — METRONIDAZOLE 250 MG PO TABS: 250.0000 mg | ORAL_TABLET | Freq: Three times a day (TID) | ORAL | 0 refills | Status: AC

## 2020-04-08 NOTE — Progress Notes (Signed)
Will treat giardia per up to date with 250 mg orally three times per day; duration 5 to 7 days.   I called patient w/ results and discussed. No alcohol while on flagyl. States his son is having similar diarrhea/abd cramping issues as well. Asked patient to call w/ update of symptoms when he finishes antibiotics.

## 2020-04-08 NOTE — Telephone Encounter (Signed)
I called pt w/ results and discussed. Thanks.

## 2020-04-08 NOTE — Telephone Encounter (Signed)
Patient called regarding test results - please advise - [phone]

## 2020-07-05 DIAGNOSIS — Z23 Encounter for immunization: Secondary | ICD-10-CM | POA: Diagnosis not present

## 2020-08-01 ENCOUNTER — Other Ambulatory Visit (INDEPENDENT_AMBULATORY_CARE_PROVIDER_SITE_OTHER): Payer: Self-pay | Admitting: Gastroenterology

## 2020-08-01 DIAGNOSIS — K219 Gastro-esophageal reflux disease without esophagitis: Secondary | ICD-10-CM

## 2020-08-01 DIAGNOSIS — R197 Diarrhea, unspecified: Secondary | ICD-10-CM

## 2020-08-01 DIAGNOSIS — R1011 Right upper quadrant pain: Secondary | ICD-10-CM

## 2020-11-29 ENCOUNTER — Other Ambulatory Visit (INDEPENDENT_AMBULATORY_CARE_PROVIDER_SITE_OTHER): Payer: Self-pay | Admitting: Internal Medicine

## 2020-11-29 DIAGNOSIS — K219 Gastro-esophageal reflux disease without esophagitis: Secondary | ICD-10-CM

## 2020-11-29 DIAGNOSIS — R197 Diarrhea, unspecified: Secondary | ICD-10-CM

## 2020-11-29 DIAGNOSIS — R1011 Right upper quadrant pain: Secondary | ICD-10-CM

## 2020-11-29 NOTE — Telephone Encounter (Signed)
Last seen July 15,2021 by Liborio Nixon for Genella Rife.

## 2021-01-20 ENCOUNTER — Other Ambulatory Visit (HOSPITAL_COMMUNITY): Payer: Self-pay | Admitting: Physician Assistant

## 2021-01-20 ENCOUNTER — Ambulatory Visit (HOSPITAL_COMMUNITY)
Admission: RE | Admit: 2021-01-20 | Discharge: 2021-01-20 | Disposition: A | Discharge status: Home / Self Care | Payer: BC Managed Care – PPO | Source: Ambulatory Visit | Attending: Physician Assistant | Admitting: Physician Assistant

## 2021-01-20 ENCOUNTER — Other Ambulatory Visit: Payer: Self-pay

## 2021-01-20 DIAGNOSIS — R55 Syncope and collapse: Secondary | ICD-10-CM

## 2021-01-23 ENCOUNTER — Encounter (INDEPENDENT_AMBULATORY_CARE_PROVIDER_SITE_OTHER): Payer: Self-pay | Admitting: Gastroenterology

## 2021-01-23 ENCOUNTER — Telehealth (INDEPENDENT_AMBULATORY_CARE_PROVIDER_SITE_OTHER): Payer: Self-pay

## 2021-01-23 ENCOUNTER — Ambulatory Visit (INDEPENDENT_AMBULATORY_CARE_PROVIDER_SITE_OTHER): Payer: BC Managed Care – PPO | Admitting: Gastroenterology

## 2021-01-23 ENCOUNTER — Other Ambulatory Visit: Payer: Self-pay

## 2021-01-23 ENCOUNTER — Encounter (INDEPENDENT_AMBULATORY_CARE_PROVIDER_SITE_OTHER): Payer: Self-pay

## 2021-01-23 ENCOUNTER — Other Ambulatory Visit (INDEPENDENT_AMBULATORY_CARE_PROVIDER_SITE_OTHER): Payer: Self-pay

## 2021-01-23 DIAGNOSIS — Z8619 Personal history of other infectious and parasitic diseases: Secondary | ICD-10-CM | POA: Diagnosis not present

## 2021-01-23 DIAGNOSIS — R7401 Elevation of levels of liver transaminase levels: Secondary | ICD-10-CM | POA: Diagnosis not present

## 2021-01-23 DIAGNOSIS — K529 Noninfective gastroenteritis and colitis, unspecified: Secondary | ICD-10-CM | POA: Diagnosis not present

## 2021-01-23 MED ORDER — PEG 3350-KCL-NA BICARB-NACL 420 G PO SOLR: 4000.0000 mL | ORAL | 0 refills | Status: DC

## 2021-01-23 NOTE — H&P (View-Only) (Signed)
Katrinka Blazing, M.D. Gastroenterology & Hepatology [facility] [address] [place], [place] [address]  Primary Care Physician: Selinda Flavin, MD [address] [place] [address]  I will communicate my assessment and recommendations to the referring MD via EMR.  Problems: 1. Elevated ALT 2. Chronic right upper quadrant pain 3. Loose bowel movements 4. history of giardiasis  History of Present Illness: Mario Jones is a 50 y.o. male with past medical history of GERD, history of giardiasis, hyperlipidemia, who presents for follow up of elevated ALT.  The patient was last seen on 03/31/2020. At that time, the patient had stool testing for CAD and GI pathogen panel, as well as CMP and CBC with lipase checked.Lab results showed a CMP with ALT of 52, rest of liver enzymes and renal function were within normal limits. CBC was WNL.  Lipase was normal 20.  Stool testing was positive was positive for Giardia norovirus.  The patient was prescribed a 5-day course of Flagyl to 50 mg 3 times daily.  C. difficile was negative in stool.  The patient brings lab reports from most recent testing performed on 01/06/2021 which showed CMP with potassium of 5.6, rest of electrolytes within normal limits, total bili 0.6, albumin 5.0, ALT 83 elevated, AST 36 mildly elevated, alkaline phosphatase 87, lipase was 31.  Patient reports that for the last year he has presented 2 watery bowel movements per day. States that as long as he takes Imodium his bowel movements are more solid. Stools are usually orange. He also reports having recurrent frequent nausea without vomiting, does not take any medications for this. Nausea has been going on for the last year.  He also reports a recurrent "ache" in his RUQ for the last year.  He reports that since he had COVID-19 in December 2021, he has not been tolerate coffee or sweets.  The patient denies having any vomiting, fever,  chills, hematochezia, melena, hematemesis, abdominal distention, jaundice, pruritus or weight loss.  Last EGD: when EUS was performed Last Colonoscopy: >10 years ago, normal  EUS 2014 - Pancreas parenchyma: The parenchyma in the uncinate pancreas was homogenous, with a normal "salt and pepper" appearance. The parenchyma in the head, body and imaged tail of the pancreas was homogenous, with a normal "salt and pepper" appearance.   Pancreas duct: The pancreas duct measured 1.6 mm in maximum diameter in the head of the pancreas, 1.2 mm in maximum diameter in the body of the pancreas and 2.1 mm in the Genu. The duct was normal in echotexture and contour. No intra-ductal stones were noted. No dilated side-branches were noted.   Peri-pancreatic vasculature: Portal vein, splenic vein and porto-splenic confluence were imaged and appeared normal. The imaged superior mesenteric vein and artery were imaged and appeared normal.   Bile Duct: The bile duct was thoroughly imaged at the level of the porta-hepatis, head of the pancreas and ampulla. The maximum diameter of the bile duct was 4.9 mm in the mid duct. The bile duct measured 3.8 mm in the head of the pancreas. The bile duct was normal in appearance. No intrinsic stones, mass or sludge were noted. The bile duct and the pancreatic duct were imaged within the ampulla and appeared normal. The bile duct measured 3.8 mm as it approached the ampulla.   FHx: mother has autoimmune hepatitis, neg for any gastrointestinal/liver disease, no malignancies Social: neg smoking, alcohol or illicit drug use Surgical: cholecystectomy 10 years ago, inguinal  surgery  Past Medical History: Past Medical History:  Diagnosis Date  . GERD (gastroesophageal reflux disease)   . History of cardiac catheterization    Normal coronaries April 2017  . Hyperlipidemia   . Osteoarthritis   . Seasonal allergies   . Statin intolerance     Past Surgical History: Past Surgical  History:  Procedure Laterality Date  . ARTHROSCOPY KNEE W/ DRILLING    . CARDIAC CATHETERIZATION N/A 12/22/2015   Procedure: Left Heart Cath and Coronary Angiography;  Surgeon: Tonny Bollman, MD;  Location: [facility] INVASIVE CV LAB;  Service: Cardiovascular;  Laterality: N/A;  . CHOLECYSTECTOMY    . COLONOSCOPY    . INGUINAL HERNIA REPAIR    . KNEE ARTHROSCOPY W/ ACL RECONSTRUCTION    . UPPER GASTROINTESTINAL ENDOSCOPY      Family History: Family History  Problem Relation Age of Onset  . Migraines Mother   . Autoimmune disease Mother   . Heart disease Father        Age 77s  . Healthy Son   . Asthma Son   . Heart disease Sister        Age 74 - sudden cardiac death    Social History: Social History   Tobacco Use  Smoking Status Never Smoker  Smokeless Tobacco Former Neurosurgeon  . Types: Chew   Social History   Substance and Sexual Activity  Alcohol Use No  . Alcohol/week: 0.0 standard drinks   Social History   Substance and Sexual Activity  Drug Use No    Allergies: Allergies  Allergen Reactions  . Morphine And Related Nausea And Vomiting    Medications: Current Outpatient Medications  Medication Sig Dispense Refill  . acetaminophen (TYLENOL) 650 MG CR tablet Take 650 mg by mouth every 8 (eight) hours as needed for pain.    Marland Kitchen ibuprofen (ADVIL,MOTRIN) 200 MG tablet Take 400-600 mg by mouth daily as needed for moderate pain.    . melatonin 3 MG TABS tablet Take by mouth.    . Multiple Vitamins-Minerals (MULTIVITAMIN WITH MINERALS) tablet Take 1 tablet by mouth daily.    . pantoprazole (PROTONIX) 40 MG tablet Take 40 mg by mouth every morning.      No current facility-administered medications for this visit.    Review of Systems: GENERAL: negative for malaise, night sweats HEENT: No changes in hearing or vision, no nose bleeds or other nasal problems. NECK: Negative for lumps, goiter, pain and significant neck swelling RESPIRATORY: Negative for cough,  wheezing CARDIOVASCULAR: Negative for chest pain, leg swelling, palpitations, orthopnea GI: SEE HPI MUSCULOSKELETAL: Negative for joint pain or swelling, back pain, and muscle pain. SKIN: Negative for lesions, rash PSYCH: Negative for sleep disturbance, mood disorder and recent psychosocial stressors. HEMATOLOGY Negative for prolonged bleeding, bruising easily, and swollen nodes. ENDOCRINE: Negative for cold or heat intolerance, polyuria, polydipsia and goiter. NEURO: negative for tremor, gait imbalance, syncope and seizures. The remainder of the review of systems is noncontributory.   Physical Exam: BP (!) 136/98 (BP Location: Left Arm, Patient Position: Sitting, Cuff Size: Large)   Pulse 98   Temp 98.5 F (36.9 C) (Oral)   Ht 6' (1.829 m)   Wt 228 lb (103.4 kg)   BMI 30.92 kg/m  GENERAL: The patient is AO x3, in no acute distress. HEENT: Head is normocephalic and atraumatic. EOMI are intact. Mouth is well hydrated and without lesions. NECK: Supple. No masses LUNGS: Clear to auscultation. No presence of rhonchi/wheezing/rales. Adequate chest expansion HEART: RRR, normal s1  and s2. ABDOMEN: mild discomfort in RUQ, no guarding, no peritoneal signs, and nondistended. BS +. No masses. EXTREMITIES: Without any cyanosis, clubbing, rash, lesions or edema. NEUROLOGIC: AOx3, no focal motor deficit. SKIN: no jaundice, no rashes  Imaging/Labs: as above  I personally reviewed and interpreted the available labs, imaging and endoscopic files.  Impression and Plan: Mario Jones is a 50 y.o. male with past medical history of GERD, history of giardiasis, hyperlipidemia, who presents for follow up of elevated ALT.in terms of his elevated ALT, I explained to the patient that there are multiple etiologies that we will need to explore including viral, autoimmune or metabolic, for which serology will be tested today.  Notably, his mother has a history of vitamin D hepatitis.  We will also check a  liver ultrasound to evaluate for possible fatty liver.  I do not consider this is related to alcohol as the patient drinks very seldom.  We will also check a CK level as the ALT elevation could be related to muscle damage.  In terms of his recurrent diarrhea episodes, will check if he has had resolution of giardiasis with stool testing, I will recheck celiac testing today.  Finally, we will schedule colonoscopy for screening purposes and the patient has not had 1 in 10 years.  If the testing for diarrhea comes back negative, will consider doing random colonic biopsies for evaluation of microscopic colitis.  - Check hepatitis A/B/C serologies, iron panel, ANA, ASMA, IgG, TTG IgA, ceruloplasmin,  liver US, CK - Check C. Diff, GI path  in stool - Schedule colonoscopy  All questions were answered.      Dolores Frame, MD Gastroenterology and Hepatology [facility]

## 2021-01-23 NOTE — Telephone Encounter (Signed)
Mario Jones, CMA  

## 2021-01-23 NOTE — Patient Instructions (Signed)
Perform blood workup Perform stool workup Schedule colonoscopy

## 2021-01-23 NOTE — Progress Notes (Signed)
Katrinka Blazing, M.D. Gastroenterology & Hepatology [facility] [address] [place], [place] [address]  Primary Care Physician: Selinda Flavin, MD [address] [place] [address]  I will communicate my assessment and recommendations to the referring MD via EMR.  Problems: 1. Elevated ALT 2. Chronic right upper quadrant pain 3. Loose bowel movements 4. history of giardiasis  History of Present Illness: Mario Jones is a 50 y.o. male with past medical history of GERD, history of giardiasis, hyperlipidemia, who presents for follow up of elevated ALT.  The patient was last seen on 03/31/2020. At that time, the patient had stool testing for CAD and GI pathogen panel, as well as CMP and CBC with lipase checked.Lab results showed a CMP with ALT of 52, rest of liver enzymes and renal function were within normal limits. CBC was WNL.  Lipase was normal 20.  Stool testing was positive was positive for Giardia norovirus.  The patient was prescribed a 5-day course of Flagyl to 50 mg 3 times daily.  C. difficile was negative in stool.  The patient brings lab reports from most recent testing performed on 01/06/2021 which showed CMP with potassium of 5.6, rest of electrolytes within normal limits, total bili 0.6, albumin 5.0, ALT 83 elevated, AST 36 mildly elevated, alkaline phosphatase 87, lipase was 31.  Patient reports that for the last year he has presented 2 watery bowel movements per day. States that as long as he takes Imodium his bowel movements are more solid. Stools are usually orange. He also reports having recurrent frequent nausea without vomiting, does not take any medications for this. Nausea has been going on for the last year.  He also reports a recurrent "ache" in his RUQ for the last year.  He reports that since he had COVID-19 in December 2021, he has not been tolerate coffee or sweets.  The patient denies having any vomiting, fever,  chills, hematochezia, melena, hematemesis, abdominal distention, jaundice, pruritus or weight loss.  Last EGD: when EUS was performed Last Colonoscopy: >10 years ago, normal  EUS 2014 - Pancreas parenchyma: The parenchyma in the uncinate pancreas was homogenous, with a normal "salt and pepper" appearance. The parenchyma in the head, body and imaged tail of the pancreas was homogenous, with a normal "salt and pepper" appearance.   Pancreas duct: The pancreas duct measured 1.6 mm in maximum diameter in the head of the pancreas, 1.2 mm in maximum diameter in the body of the pancreas and 2.1 mm in the Genu. The duct was normal in echotexture and contour. No intra-ductal stones were noted. No dilated side-branches were noted.   Peri-pancreatic vasculature: Portal vein, splenic vein and porto-splenic confluence were imaged and appeared normal. The imaged superior mesenteric vein and artery were imaged and appeared normal.   Bile Duct: The bile duct was thoroughly imaged at the level of the porta-hepatis, head of the pancreas and ampulla. The maximum diameter of the bile duct was 4.9 mm in the mid duct. The bile duct measured 3.8 mm in the head of the pancreas. The bile duct was normal in appearance. No intrinsic stones, mass or sludge were noted. The bile duct and the pancreatic duct were imaged within the ampulla and appeared normal. The bile duct measured 3.8 mm as it approached the ampulla.   FHx: mother has autoimmune hepatitis, neg for any gastrointestinal/liver disease, no malignancies Social: neg smoking, alcohol or illicit drug use Surgical: cholecystectomy 10 years ago, inguinal  surgery  Past Medical History: Past Medical History:  Diagnosis Date  . GERD (gastroesophageal reflux disease)   . History of cardiac catheterization    Normal coronaries April 2017  . Hyperlipidemia   . Osteoarthritis   . Seasonal allergies   . Statin intolerance     Past Surgical History: Past Surgical  History:  Procedure Laterality Date  . ARTHROSCOPY KNEE W/ DRILLING    . CARDIAC CATHETERIZATION N/A 12/22/2015   Procedure: Left Heart Cath and Coronary Angiography;  Surgeon: Tonny Bollman, MD;  Location: [facility] INVASIVE CV LAB;  Service: Cardiovascular;  Laterality: N/A;  . CHOLECYSTECTOMY    . COLONOSCOPY    . INGUINAL HERNIA REPAIR    . KNEE ARTHROSCOPY W/ ACL RECONSTRUCTION    . UPPER GASTROINTESTINAL ENDOSCOPY      Family History: Family History  Problem Relation Age of Onset  . Migraines Mother   . Autoimmune disease Mother   . Heart disease Father        Age 77s  . Healthy Son   . Asthma Son   . Heart disease Sister        Age 74 - sudden cardiac death    Social History: Social History   Tobacco Use  Smoking Status Never Smoker  Smokeless Tobacco Former Neurosurgeon  . Types: Chew   Social History   Substance and Sexual Activity  Alcohol Use No  . Alcohol/week: 0.0 standard drinks   Social History   Substance and Sexual Activity  Drug Use No    Allergies: Allergies  Allergen Reactions  . Morphine And Related Nausea And Vomiting    Medications: Current Outpatient Medications  Medication Sig Dispense Refill  . acetaminophen (TYLENOL) 650 MG CR tablet Take 650 mg by mouth every 8 (eight) hours as needed for pain.    Marland Kitchen ibuprofen (ADVIL,MOTRIN) 200 MG tablet Take 400-600 mg by mouth daily as needed for moderate pain.    . melatonin 3 MG TABS tablet Take by mouth.    . Multiple Vitamins-Minerals (MULTIVITAMIN WITH MINERALS) tablet Take 1 tablet by mouth daily.    . pantoprazole (PROTONIX) 40 MG tablet Take 40 mg by mouth every morning.      No current facility-administered medications for this visit.    Review of Systems: GENERAL: negative for malaise, night sweats HEENT: No changes in hearing or vision, no nose bleeds or other nasal problems. NECK: Negative for lumps, goiter, pain and significant neck swelling RESPIRATORY: Negative for cough,  wheezing CARDIOVASCULAR: Negative for chest pain, leg swelling, palpitations, orthopnea GI: SEE HPI MUSCULOSKELETAL: Negative for joint pain or swelling, back pain, and muscle pain. SKIN: Negative for lesions, rash PSYCH: Negative for sleep disturbance, mood disorder and recent psychosocial stressors. HEMATOLOGY Negative for prolonged bleeding, bruising easily, and swollen nodes. ENDOCRINE: Negative for cold or heat intolerance, polyuria, polydipsia and goiter. NEURO: negative for tremor, gait imbalance, syncope and seizures. The remainder of the review of systems is noncontributory.   Physical Exam: BP (!) 136/98 (BP Location: Left Arm, Patient Position: Sitting, Cuff Size: Large)   Pulse 98   Temp 98.5 F (36.9 C) (Oral)   Ht 6' (1.829 m)   Wt 228 lb (103.4 kg)   BMI 30.92 kg/m  GENERAL: The patient is AO x3, in no acute distress. HEENT: Head is normocephalic and atraumatic. EOMI are intact. Mouth is well hydrated and without lesions. NECK: Supple. No masses LUNGS: Clear to auscultation. No presence of rhonchi/wheezing/rales. Adequate chest expansion HEART: RRR, normal s1  and s2. ABDOMEN: mild discomfort in RUQ, no guarding, no peritoneal signs, and nondistended. BS +. No masses. EXTREMITIES: Without any cyanosis, clubbing, rash, lesions or edema. NEUROLOGIC: AOx3, no focal motor deficit. SKIN: no jaundice, no rashes  Imaging/Labs: as above  I personally reviewed and interpreted the available labs, imaging and endoscopic files.  Impression and Plan: Mario Jones is a 50 y.o. male with past medical history of GERD, history of giardiasis, hyperlipidemia, who presents for follow up of elevated ALT.in terms of his elevated ALT, I explained to the patient that there are multiple etiologies that we will need to explore including viral, autoimmune or metabolic, for which serology will be tested today.  Notably, his mother has a history of vitamin D hepatitis.  We will also check a  liver ultrasound to evaluate for possible fatty liver.  I do not consider this is related to alcohol as the patient drinks very seldom.  We will also check a CK level as the ALT elevation could be related to muscle damage.  In terms of his recurrent diarrhea episodes, will check if he has had resolution of giardiasis with stool testing, I will recheck celiac testing today.  Finally, we will schedule colonoscopy for screening purposes and the patient has not had 1 in 10 years.  If the testing for diarrhea comes back negative, will consider doing random colonic biopsies for evaluation of microscopic colitis.  - Check hepatitis A/B/C serologies, iron panel, ANA, ASMA, IgG, TTG IgA, ceruloplasmin,  liver US, CK - Check C. Diff, GI path  in stool - Schedule colonoscopy  All questions were answered.      Dolores Frame, MD Gastroenterology and Hepatology [facility]

## 2021-01-24 ENCOUNTER — Encounter (INDEPENDENT_AMBULATORY_CARE_PROVIDER_SITE_OTHER): Payer: Self-pay

## 2021-01-30 LAB — GASTROINTESTINAL PATHOGEN PANEL PCR
C. difficile Tox A/B, PCR: NOT DETECTED
Campylobacter, PCR: NOT DETECTED
Cryptosporidium, PCR: NOT DETECTED
E coli (ETEC) LT/ST PCR: NOT DETECTED
E coli (STEC) stx1/stx2, PCR: NOT DETECTED
E coli 0157, PCR: NOT DETECTED
Giardia lamblia, PCR: NOT DETECTED
Norovirus, PCR: NOT DETECTED
Rotavirus A, PCR: NOT DETECTED
Salmonella, PCR: NOT DETECTED
Shigella, PCR: NOT DETECTED

## 2021-01-30 LAB — C. DIFFICILE GDH AND TOXIN A/B
GDH ANTIGEN: NOT DETECTED
MICRO NUMBER:: 11879383
SPECIMEN QUALITY:: ADEQUATE
TOXIN A AND B: NOT DETECTED

## 2021-02-01 ENCOUNTER — Ambulatory Visit (HOSPITAL_COMMUNITY)
Admission: RE | Admit: 2021-02-01 | Discharge: 2021-02-01 | Disposition: A | Discharge status: Home / Self Care | Payer: BC Managed Care – PPO | Source: Ambulatory Visit | Attending: Gastroenterology | Admitting: Gastroenterology

## 2021-02-01 DIAGNOSIS — R7401 Elevation of levels of liver transaminase levels: Secondary | ICD-10-CM

## 2021-02-03 ENCOUNTER — Telehealth (INDEPENDENT_AMBULATORY_CARE_PROVIDER_SITE_OTHER): Payer: Self-pay

## 2021-02-03 NOTE — Telephone Encounter (Signed)
I spoke with Micaya at [facility] she states the Alpha 1 anti trypsin is the only one pending. The Ck order was separate from the rest of the orders so the lab over looked it and it was not drawn. It is too late to add on. Does the patient need to have this redrawn? Please advise.  Can you please check with his lab if they have results from the CK and alpha 1 anti trypsin?

## 2021-02-03 NOTE — Telephone Encounter (Signed)
Hi,  Yes, the CK needs to be redrawn. Thanks

## 2021-02-06 NOTE — Telephone Encounter (Signed)
Patient aware he states he will have it drawn on 02/07/2021. He will pick up order from the front desk on 02/07/2021.

## 2021-02-06 NOTE — Telephone Encounter (Signed)
I called and left a vm, asked that the patient return call.  Order mailed to the patient.

## 2021-02-07 ENCOUNTER — Other Ambulatory Visit (INDEPENDENT_AMBULATORY_CARE_PROVIDER_SITE_OTHER): Payer: Self-pay

## 2021-02-07 ENCOUNTER — Other Ambulatory Visit (INDEPENDENT_AMBULATORY_CARE_PROVIDER_SITE_OTHER): Payer: Self-pay | Admitting: Gastroenterology

## 2021-02-07 DIAGNOSIS — Z1211 Encounter for screening for malignant neoplasm of colon: Secondary | ICD-10-CM

## 2021-02-08 LAB — CK: Total CK: 104 U/L (ref 44–196)

## 2021-02-10 ENCOUNTER — Other Ambulatory Visit: Payer: Self-pay

## 2021-02-10 ENCOUNTER — Other Ambulatory Visit (HOSPITAL_COMMUNITY)
Admission: RE | Admit: 2021-02-10 | Discharge: 2021-02-10 | Disposition: A | Discharge status: Home / Self Care | Payer: BC Managed Care – PPO | Source: Ambulatory Visit | Attending: Gastroenterology | Admitting: Gastroenterology

## 2021-02-10 DIAGNOSIS — Z01812 Encounter for preprocedural laboratory examination: Secondary | ICD-10-CM | POA: Diagnosis not present

## 2021-02-10 DIAGNOSIS — Z20822 Contact with and (suspected) exposure to covid-19: Secondary | ICD-10-CM | POA: Diagnosis not present

## 2021-02-11 LAB — SARS CORONAVIRUS 2 (TAT 6-24 HRS): SARS Coronavirus 2: NEGATIVE

## 2021-02-14 ENCOUNTER — Ambulatory Visit (HOSPITAL_COMMUNITY): Payer: BC Managed Care – PPO | Admitting: Anesthesiology

## 2021-02-14 ENCOUNTER — Other Ambulatory Visit: Payer: Self-pay

## 2021-02-14 ENCOUNTER — Encounter (HOSPITAL_COMMUNITY): Payer: Self-pay | Admitting: Gastroenterology

## 2021-02-14 ENCOUNTER — Ambulatory Visit (HOSPITAL_COMMUNITY)
Admission: RE | Admit: 2021-02-14 | Discharge: 2021-02-14 | Disposition: A | Discharge status: Home / Self Care | Payer: BC Managed Care – PPO | Attending: Gastroenterology | Admitting: Gastroenterology

## 2021-02-14 ENCOUNTER — Encounter (HOSPITAL_COMMUNITY)
Admission: RE | Disposition: A | Discharge status: Home / Self Care | Payer: Self-pay | Source: Home / Self Care | Attending: Gastroenterology

## 2021-02-14 DIAGNOSIS — Z825 Family history of asthma and other chronic lower respiratory diseases: Secondary | ICD-10-CM | POA: Diagnosis not present

## 2021-02-14 DIAGNOSIS — R197 Diarrhea, unspecified: Secondary | ICD-10-CM

## 2021-02-14 DIAGNOSIS — K219 Gastro-esophageal reflux disease without esophagitis: Secondary | ICD-10-CM | POA: Insufficient documentation

## 2021-02-14 DIAGNOSIS — Z87891 Personal history of nicotine dependence: Secondary | ICD-10-CM | POA: Diagnosis not present

## 2021-02-14 DIAGNOSIS — Z8249 Family history of ischemic heart disease and other diseases of the circulatory system: Secondary | ICD-10-CM | POA: Insufficient documentation

## 2021-02-14 DIAGNOSIS — E785 Hyperlipidemia, unspecified: Secondary | ICD-10-CM | POA: Diagnosis not present

## 2021-02-14 DIAGNOSIS — Z8616 Personal history of COVID-19: Secondary | ICD-10-CM | POA: Diagnosis not present

## 2021-02-14 DIAGNOSIS — R1011 Right upper quadrant pain: Secondary | ICD-10-CM | POA: Insufficient documentation

## 2021-02-14 DIAGNOSIS — R7401 Elevation of levels of liver transaminase levels: Secondary | ICD-10-CM | POA: Insufficient documentation

## 2021-02-14 DIAGNOSIS — Z791 Long term (current) use of non-steroidal anti-inflammatories (NSAID): Secondary | ICD-10-CM | POA: Insufficient documentation

## 2021-02-14 DIAGNOSIS — Z885 Allergy status to narcotic agent status: Secondary | ICD-10-CM | POA: Diagnosis not present

## 2021-02-14 DIAGNOSIS — Z1211 Encounter for screening for malignant neoplasm of colon: Secondary | ICD-10-CM

## 2021-02-14 DIAGNOSIS — Z79899 Other long term (current) drug therapy: Secondary | ICD-10-CM | POA: Insufficient documentation

## 2021-02-14 DIAGNOSIS — G8929 Other chronic pain: Secondary | ICD-10-CM | POA: Insufficient documentation

## 2021-02-14 HISTORY — PX: COLONOSCOPY WITH PROPOFOL: SHX5780

## 2021-02-14 HISTORY — PX: BIOPSY: SHX5522

## 2021-02-14 LAB — HM COLONOSCOPY

## 2021-02-14 SURGERY — COLONOSCOPY WITH PROPOFOL
Anesthesia: General

## 2021-02-14 MED ORDER — PROPOFOL 10 MG/ML IV BOLUS
INTRAVENOUS | Status: DC | PRN
  Administered 2021-02-14: 100 mg via INTRAVENOUS
  Administered 2021-02-14: 50 mg via INTRAVENOUS
  Administered 2021-02-14: 100 mg via INTRAVENOUS
  Administered 2021-02-14: 50 mg via INTRAVENOUS

## 2021-02-14 MED ORDER — LACTATED RINGERS IV SOLN: INTRAVENOUS | Status: DC

## 2021-02-14 NOTE — Interval H&P Note (Signed)
History and Physical Interval Note:  02/14/2021 7:34 AM  Mario Jones is a 50 y.o. male with past medical history of GERD, history of giardiasis, hyperlipidemia, who presents for evaluation of watery to loose bowel movements.  Patient states that he is still having evidence of watery bowel movements which improved slightly with the use of Imodium.  Has a max of 2  bowel movements as he is taking the antidiarrheal.  Denies any melena, hematochezia, abdominal pain.  Has not tried any fiber supplement.  BP (!) 149/107   Pulse 85   Temp 98.7 F (37.1 C) (Oral)   Resp 16   Ht 6' (1.829 m)   Wt 97.1 kg   SpO2 97%   BMI 29.02 kg/m  GENERAL: The patient is AO x3, in no acute distress. HEENT: Head is normocephalic and atraumatic. EOMI are intact. Mouth is well hydrated and without lesions. NECK: Supple. No masses LUNGS: Clear to auscultation. No presence of rhonchi/wheezing/rales. Adequate chest expansion HEART: RRR, normal s1 and s2. ABDOMEN: Soft, nontender, no guarding, no peritoneal signs, and nondistended. BS +. No masses. EXTREMITIES: Without any cyanosis, clubbing, rash, lesions or edema. NEUROLOGIC: AOx3, no focal motor deficit. SKIN: no jaundice, no rashes   RIGDON MACOMBER  has presented today for surgery, with the diagnosis of Screening Colonoscopy.  The various methods of treatment have been discussed with the patient and family. After consideration of risks, benefits and other options for treatment, the patient has consented to  Procedure(s) with comments: COLONOSCOPY WITH PROPOFOL (N/A) - 8:15 as a surgical intervention.  The patient's history has been reviewed, patient examined, no change in status, stable for surgery.  I have reviewed the patient's chart and labs.  Questions were answered to the patient's satisfaction.     Katrinka Blazing [place]

## 2021-02-14 NOTE — Transfer of Care (Signed)
Immediate Anesthesia Transfer of Care Note  Patient: Mario Jones  Procedure(s) Performed: COLONOSCOPY WITH PROPOFOL (N/A ) BIOPSY  Patient Location: PACU  Anesthesia Type:MAC  Level of Consciousness: awake, alert  and oriented  Airway & Oxygen Therapy: Patient Spontanous Breathing  Post-op Assessment: Report given to RN and Post -op Vital signs reviewed and stable  Post vital signs: Reviewed and stable  Last Vitals:  Vitals Value Taken Time  BP    Temp    Pulse    Resp    SpO2      Last Pain:  Vitals:   02/14/21 0807  TempSrc:   PainSc: 0-No pain      Patients Stated Pain Goal: 8 (60/10/93 2355)  Complications: No complications documented.

## 2021-02-14 NOTE — Op Note (Signed)
[facility] Patient Name: Mario Jones Procedure Date: 02/14/2021 8:02 AM MRN: [id number] Date of Birth: April 26, 1971 Attending MD: Katrinka Blazing ,  CSN: [id number] Age: 50 Admit Type: Outpatient Procedure:                Colonoscopy Indications:              Clinically significant diarrhea of unexplained                            origin Providers:                Katrinka Blazing, Criselda Peaches. Patsy Lager, RN, Burke Keels, Technician Referring MD:              Medicines:                Monitored Anesthesia Care Complications:            No immediate complications. Estimated Blood Loss:     Estimated blood loss: none. Procedure:                Pre-Anesthesia Assessment:                           - Prior to the procedure, a History and Physical                            was performed, and patient medications, allergies                            and sensitivities were reviewed. The patient's                            tolerance of previous anesthesia was reviewed.                           - The risks and benefits of the procedure and the                            sedation options and risks were discussed with the                            patient. All questions were answered and informed                            consent was obtained.                           - ASA Grade Assessment: II - A patient with mild                            systemic disease.                           After obtaining informed consent, the colonoscope  was passed under direct vision. Throughout the                            procedure, the patient's blood pressure, pulse, and                            oxygen saturations were monitored continuously. The                            [id number]-[id number] ([id number]) scope was introduced through                            the anus and advanced to the the cecum, identified                            by appendiceal  orifice and ileocecal valve. The                            colonoscopy was performed without difficulty. The                            patient tolerated the procedure well. The quality                            of the bowel preparation was excellent. Scope In: 8:12:22 AM Scope Out: 8:30:15 AM Scope Withdrawal Time: 0 hours 13 minutes 34 seconds  Total Procedure Duration: 0 hours 17 minutes 53 seconds  Findings:      The perianal and digital rectal examinations were normal.      The colon (entire examined portion) appeared normal. Biopsies for       histology were taken with a cold forceps from the right transverse colon       and left transverse colon for evaluation of microscopic colitis.      The retroflexed view of the distal rectum and anal verge was normal and       showed no anal or rectal abnormalities. Impression:               - The entire examined colon is normal. Biopsied.                           - The distal rectum and anal verge are normal on                            retroflexion view. Moderate Sedation:      Per Anesthesia Care Recommendation:           - Discharge patient to home (ambulatory).                           - Resume previous diet.                           - Await pathology results.                           - Repeat  colonoscopy in 10 years for screening                            purposes. Procedure Code(s):        --- Professional ---                           [phone], Colonoscopy, flexible; with biopsy, single                            or multiple Diagnosis Code(s):        --- Professional ---                           R19.7, Diarrhea, unspecified CPT copyright 2019 American Medical Association. All rights reserved. The codes documented in this report are preliminary and upon coder review may  be revised to meet current compliance requirements. Katrinka Blazing, MD Katrinka Blazing,  02/14/2021 8:35:54 AM This report has been signed  electronically. Number of Addenda: 0

## 2021-02-14 NOTE — Discharge Instructions (Signed)
You are being discharged to home.  Resume your previous diet.  We are waiting for your pathology results.  Your physician has recommended a repeat colonoscopy in 10 years for screening purposes.  Can take Benefiber daily.      Colonoscopy, Adult, Care After This sheet gives you information about how to care for yourself after your procedure. Your doctor may also give you more specific instructions. If you have problems or questions, call your doctor. What can I expect after the procedure? After the procedure, it is common to have:  A small amount of blood in your poop (stool) for 24 hours.  Some gas.  Mild cramping or bloating in your belly (abdomen). Follow these instructions at home: Eating and drinking  Drink enough fluid to keep your pee (urine) pale yellow.  Follow instructions from your doctor about what you cannot eat or drink.  Return to your normal diet as told by your doctor. Avoid heavy or fried foods that are hard to digest.   Activity  Rest as told by your doctor.  Do not sit for a long time without moving. Get up to take short walks every 1-2 hours. This is important. Ask for help if you feel weak or unsteady.  Return to your normal activities as told by your doctor. Ask your doctor what activities are safe for you. To help cramping and bloating:  Try walking around.  Put heat on your belly as told by your doctor. Use the heat source that your doctor recommends, such as a moist heat pack or a heating pad. ? Put a towel between your skin and the heat source. ? Leave the heat on for 20-30 minutes. ? Remove the heat if your skin turns bright red. This is very important if you are unable to feel pain, heat, or cold. You may have a greater risk of getting burned.   General instructions  If you were given a medicine to help you relax (sedative) during your procedure, it can affect you for many hours. Do not drive or use machinery until your doctor says that it is  safe.  For the first 24 hours after the procedure: ? Do not sign important documents. ? Do not drink alcohol. ? Do your daily activities more slowly than normal. ? Eat foods that are soft and easy to digest.  Take over-the-counter or prescription medicines only as told by your doctor.  Keep all follow-up visits as told by your doctor. This is important. Contact a doctor if:  You have blood in your poop 2-3 days after the procedure. Get help right away if:  You have more than a small amount of blood in your poop.  You see large clumps of tissue (blood clots) in your poop.  Your belly is swollen.  You feel like you may vomit (nauseous).  You vomit.  You have a fever.  You have belly pain that gets worse, and medicine does not help your pain. Summary  After the procedure, it is common to have a small amount of blood in your poop. You may also have mild cramping and bloating in your belly.  If you were given a medicine to help you relax (sedative) during your procedure, it can affect you for many hours. Do not drive or use machinery until your doctor says that it is safe.  Get help right away if you have a lot of blood in your poop, feel like you may vomit, have a fever, or have  more belly pain. This information is not intended to replace advice given to you by your health care provider. Make sure you discuss any questions you have with your health care provider. Document Revised: 07/10/2019 Document Reviewed: 03/30/2019 Elsevier Patient Education  2021 Elsevier Inc.      Monitored Anesthesia Care, Care After This sheet gives you information about how to care for yourself after your procedure. Your health care provider may also give you more specific instructions. If you have problems or questions, contact your health care provider. What can I expect after the procedure? After the procedure, it is common to have:  Tiredness.  Forgetfulness about what happened after the  procedure.  Impaired judgment for important decisions.  Nausea or vomiting.  Some difficulty with balance. Follow these instructions at home: For the time period you were told by your health care provider:  Rest as needed.  Do not participate in activities where you could fall or become injured.  Do not drive or use machinery.  Do not drink alcohol.  Do not take sleeping pills or medicines that cause drowsiness.  Do not make important decisions or sign legal documents.  Do not take care of children on your own.      Eating and drinking  Follow the diet that is recommended by your health care provider.  Drink enough fluid to keep your urine pale yellow.  If you vomit: ? Drink water, juice, or soup when you can drink without vomiting. ? Make sure you have little or no nausea before eating solid foods. General instructions  Have a responsible adult stay with you for the time you are told. It is important to have someone help care for you until you are awake and alert.  Take over-the-counter and prescription medicines only as told by your health care provider.  If you have sleep apnea, surgery and certain medicines can increase your risk for breathing problems. Follow instructions from your health care provider about wearing your sleep device: ? Anytime you are sleeping, including during daytime naps. ? While taking prescription pain medicines, sleeping medicines, or medicines that make you drowsy.  Avoid smoking.  Keep all follow-up visits as told by your health care provider. This is important. Contact a health care provider if:  You keep feeling nauseous or you keep vomiting.  You feel light-headed.  You are still sleepy or having trouble with balance after 24 hours.  You develop a rash.  You have a fever.  You have redness or swelling around the IV site. Get help right away if:  You have trouble breathing.  You have new-onset confusion at  home. Summary  For several hours after your procedure, you may feel tired. You may also be forgetful and have poor judgment.  Have a responsible adult stay with you for the time you are told. It is important to have someone help care for you until you are awake and alert.  Rest as told. Do not drive or operate machinery. Do not drink alcohol or take sleeping pills.  Get help right away if you have trouble breathing, or if you suddenly become confused. This information is not intended to replace advice given to you by your health care provider. Make sure you discuss any questions you have with your health care provider. Document Revised: 05/19/2020 Document Reviewed: 08/06/2019 Elsevier Patient Education  2021 [employer].

## 2021-02-14 NOTE — Anesthesia Preprocedure Evaluation (Signed)
Anesthesia Evaluation  Patient identified by MRN, date of birth, ID band Patient awake    Reviewed: Allergy & Precautions, H&P , NPO status , Patient's Chart, lab work & pertinent test results, reviewed documented beta blocker date and time   Airway Mallampati: II  TM Distance: >3 FB Neck ROM: full    Dental no notable dental hx.    Pulmonary neg pulmonary ROS,    Pulmonary exam normal breath sounds clear to auscultation       Cardiovascular Exercise Tolerance: Good negative cardio ROS   Rhythm:regular Rate:Normal     Neuro/Psych negative neurological ROS  negative psych ROS   GI/Hepatic Neg liver ROS, GERD  Medicated,  Endo/Other  negative endocrine ROS  Renal/GU negative Renal ROS  negative genitourinary   Musculoskeletal   Abdominal   Peds  Hematology negative hematology ROS (+)   Anesthesia Other Findings   Reproductive/Obstetrics negative OB ROS                             Anesthesia Physical Anesthesia Plan  ASA: II  Anesthesia Plan: General   Post-op Pain Management:    Induction:   PONV Risk Score and Plan: Propofol infusion  Airway Management Planned:   Additional Equipment:   Intra-op Plan:   Post-operative Plan:   Informed Consent: I have reviewed the patients History and Physical, chart, labs and discussed the procedure including the risks, benefits and alternatives for the proposed anesthesia with the patient or authorized representative who has indicated his/her understanding and acceptance.     Dental Advisory Given  Plan Discussed with: CRNA  Anesthesia Plan Comments:         Anesthesia Quick Evaluation  

## 2021-02-14 NOTE — Anesthesia Postprocedure Evaluation (Signed)
Anesthesia Post Note  Patient: Mario Jones  Procedure(s) Performed: COLONOSCOPY WITH PROPOFOL (N/A ) BIOPSY  Patient location during evaluation: Phase II Anesthesia Type: General Level of consciousness: awake Pain management: pain level controlled Vital Signs Assessment: post-procedure vital signs reviewed and stable Respiratory status: spontaneous breathing and respiratory function stable Cardiovascular status: blood pressure returned to baseline and stable Postop Assessment: no headache and no apparent nausea or vomiting Anesthetic complications: no Comments: Late entry   No complications documented.   Last Vitals:  Vitals:   02/14/21 0833 02/14/21 0836  BP: 95/70 123/90  Pulse: 89 79  Resp: 15 18  Temp: 36.8 C   SpO2: 96% 98%    Last Pain:  Vitals:   02/14/21 0836  TempSrc:   PainSc: 0-No pain                 Windell Norfolk

## 2021-02-15 LAB — SURGICAL PATHOLOGY

## 2021-02-16 ENCOUNTER — Encounter (INDEPENDENT_AMBULATORY_CARE_PROVIDER_SITE_OTHER): Payer: Self-pay | Admitting: *Deleted

## 2021-02-21 ENCOUNTER — Encounter (HOSPITAL_COMMUNITY): Payer: Self-pay | Admitting: Gastroenterology

## 2021-02-21 LAB — IGG: IgG (Immunoglobin G), Serum: 1218 mg/dL (ref 600–1640)

## 2021-02-21 LAB — COMPREHENSIVE METABOLIC PANEL
AG Ratio: 1.8 (calc) (ref 1.0–2.5)
ALT: 84 U/L — ABNORMAL HIGH (ref 9–46)
AST: 38 U/L (ref 10–40)
Albumin: 4.9 g/dL (ref 3.6–5.1)
Alkaline phosphatase (APISO): 81 U/L (ref 36–130)
BUN: 13 mg/dL (ref 7–25)
CO2: 27 mmol/L (ref 20–32)
Calcium: 10.5 mg/dL — ABNORMAL HIGH (ref 8.6–10.3)
Chloride: 102 mmol/L (ref 98–110)
Creat: 0.9 mg/dL (ref 0.60–1.35)
Globulin: 2.8 g/dL (calc) (ref 1.9–3.7)
Glucose, Bld: 79 mg/dL (ref 65–139)
Potassium: 4.1 mmol/L (ref 3.5–5.3)
Sodium: 138 mmol/L (ref 135–146)
Total Bilirubin: 0.7 mg/dL (ref 0.2–1.2)
Total Protein: 7.7 g/dL (ref 6.1–8.1)

## 2021-02-21 LAB — HEPATITIS PANEL, ACUTE
Hep A IgM: NONREACTIVE
Hep B C IgM: NONREACTIVE
Hepatitis B Surface Ag: NONREACTIVE
Hepatitis C Ab: NONREACTIVE
SIGNAL TO CUT-OFF: 0.01 (ref <1.00)

## 2021-02-21 LAB — ANTI-NUCLEAR AB-TITER (ANA TITER): ANA Titer 1: 1:80 {titer} — ABNORMAL HIGH

## 2021-02-21 LAB — CERULOPLASMIN: Ceruloplasmin: 22 mg/dL (ref 18–36)

## 2021-02-21 LAB — IRON, TOTAL/TOTAL IRON BINDING CAP
%SAT: 46 % (calc) (ref 20–48)
Iron: 188 ug/dL — ABNORMAL HIGH (ref 50–180)
TIBC: 410 mcg/dL (calc) (ref 250–425)

## 2021-02-21 LAB — IGA: Immunoglobulin A: 327 mg/dL — ABNORMAL HIGH (ref 47–310)

## 2021-02-21 LAB — FERRITIN: Ferritin: 143 ng/mL (ref 38–380)

## 2021-02-21 LAB — ANA: Anti Nuclear Antibody (ANA): POSITIVE — AB

## 2021-02-21 LAB — TISSUE TRANSGLUTAMINASE, IGA: (tTG) Ab, IgA: <1 U/mL

## 2021-02-21 LAB — ANTI-SMOOTH MUSCLE ANTIBODY, IGG: Actin (Smooth Muscle) Antibody (IGG): <20 U (ref <20)

## 2021-02-21 LAB — ALPHA-1 ANTITRYPSIN PHENOTYPE: A-1 Antitrypsin, Ser: 128 mg/dL (ref 83–199)

## 2021-04-13 ENCOUNTER — Ambulatory Visit (INDEPENDENT_AMBULATORY_CARE_PROVIDER_SITE_OTHER): Payer: BC Managed Care – PPO | Admitting: Gastroenterology

## 2021-05-22 ENCOUNTER — Encounter: Payer: Self-pay | Admitting: Cardiology

## 2021-05-22 NOTE — Progress Notes (Signed)
Cardiology Office Note  Date: 05/23/2021   ID: Mario Jones, DOB 05-30-1971, MRN [id number]  PCP:  Lovey Newcomer, PA  Cardiologist:  Nona Dell, MD Electrophysiologist:  None   Chief Complaint  Patient presents with   Shortness of Breath   Palpitations     History of Present Illness: Mario Jones is a 50 y.o. male referred for cardiology consultation by Mr. Leavy Cella PA-C with [facility] for the evaluation of shortness of breath and palpitations.  He states that over the last 2 months in particular he has felt a sense of heart skipping at rest, and more of a rapid sensation with activity.  No associated chest pain or syncope.  He has also had dyspnea on exertion however states that he has felt fatigued since having COVID-19 back in December 2021.  Only other symptom at that time was headache.  He was not hospitalized.  He does have a family history of heart disease, premature CAD in his father and also "enlarged heart" in his sister who died suddenly in her 30s.  Cardiac catheterization in 2017 revealed normal coronary arteries and his LVEF was normal at that time. Of note, the echocardiogram from 2017 did show artifact in the region of the aorta with question of aortic dissection which was not found to be the case by chest CTA.  I personally reviewed faxed copy of ECG from June showing normal sinus rhythm.  I reviewed his medications as noted below  Past Medical History:  Diagnosis Date   COVID-04 September 2020   GERD (gastroesophageal reflux disease)    History of cardiac catheterization    Normal coronaries April 2017   Hyperlipidemia    Osteoarthritis    Seasonal allergies    Statin intolerance     Past Surgical History:  Procedure Laterality Date   ARTHROSCOPY KNEE W/ DRILLING     BIOPSY  02/14/2021   Procedure: BIOPSY;  Surgeon: Dolores Frame, MD;  Location: [facility];  Service: Gastroenterology;;   CARDIAC CATHETERIZATION N/A 12/22/2015    Procedure: Left Heart Cath and Coronary Angiography;  Surgeon: Tonny Bollman, MD;  Location: [facility] INVASIVE CV LAB;  Service: Cardiovascular;  Laterality: N/A;   CHOLECYSTECTOMY     COLONOSCOPY     COLONOSCOPY WITH PROPOFOL N/A 02/14/2021   Procedure: COLONOSCOPY WITH PROPOFOL;  Surgeon: Dolores Frame, MD;  Location: [facility];  Service: Gastroenterology;  Laterality: N/A;  8:15   INGUINAL HERNIA REPAIR     KNEE ARTHROSCOPY W/ ACL RECONSTRUCTION     UPPER GASTROINTESTINAL ENDOSCOPY      Current Outpatient Medications  Medication Sig Dispense Refill   acetaminophen (TYLENOL) 650 MG CR tablet Take 650 mg by mouth every 8 (eight) hours as needed for pain.     Cholecalciferol (VITAMIN D3) 50 MCG (2000 UT) TABS Take 2,000 Units by mouth in the morning.     famotidine (PEPCID) 20 MG tablet Take 20 mg by mouth 2 (two) times daily as needed for heartburn or indigestion.     ibuprofen (ADVIL,MOTRIN) 200 MG tablet Take 400 mg by mouth every 8 (eight) hours as needed (pain.).     Ibuprofen-diphenhydrAMINE Cit (ADVIL PM) 200-38 MG TABS Take 1 tablet by mouth at bedtime.     loperamide (IMODIUM) 2 MG capsule Take 2-4 mg by mouth 4 (four) times daily as needed for diarrhea or loose stools.     melatonin 5 MG TABS Take 5 mg by mouth  at bedtime.     Multiple Vitamins-Minerals (MULTIVITAMIN WITH MINERALS) tablet Take 1 tablet by mouth in the morning. One-A-Day for Men     pantoprazole (PROTONIX) 40 MG tablet Take 40 mg by mouth in the morning.     No current facility-administered medications for this visit.   Allergies:  Gabapentin, Morphine and related, and Statins   Social History: The patient  reports that he has never smoked. He quit smokeless tobacco use about 31 years ago.  His smokeless tobacco use included chew. He reports that he does not drink alcohol and does not use drugs.   Family History: The patient's family history includes Asthma in his son; Autoimmune disease in his mother;  Healthy in his son; Heart disease in his father and sister; Migraines in his mother.   ROS: No orthopnea or PND, no leg swelling.  Physical Exam: VS:  BP 140/82   Pulse 96   Ht 6\' 1"  (1.854 m)   Wt 237 lb (107.5 kg)   SpO2 99%   BMI 31.27 kg/m , BMI Body mass index is 31.27 kg/m.  Wt Readings from Last 3 Encounters:  05/23/21 237 lb (107.5 kg)  02/14/21 214 lb (97.1 kg)  01/23/21 228 lb (103.4 kg)    General: Patient appears comfortable at rest. HEENT: Conjunctiva and lids normal, wearing a mask. Neck: Supple, no elevated JVP or carotid bruits, no thyromegaly. Lungs: Clear to auscultation, nonlabored breathing at rest. Cardiac: Regular rate and rhythm, no S3 or significant systolic murmur, no pericardial rub. Abdomen: Soft, nontender, no hepatomegaly, bowel sounds present, no guarding or rebound. Extremities: No pitting edema, distal pulses 2+. Skin: Warm and dry. Musculoskeletal: No kyphosis. Neuropsychiatric: Alert and oriented x3, affect grossly appropriate.  ECG:  An ECG dated 12/16/2015 was personally reviewed today and demonstrated:  Sinus rhythm.  Recent Labwork: 01/23/2021: ALT 84; AST 38; BUN 13; Creat 0.90; Potassium 4.1; Sodium 138  April 2022: Hemoglobin 16.8, platelets 320, BUN 18, creatinine 1.1, potassium 5.6, AST 36, ALT 83, cholesterol 254, triglycerides 170, HDL 42, LDL 180 December 2020: TSH 1.26  Other Studies Reviewed Today:  Cardiac catheterization 12/22/2015: 1. Widely patent coronary arteries with no evidence of obstructive disease. 2. Intramyocardial bridging of the mid LAD. 3. Normal LV function by echo and normal LVEDP   Suspect noncardiac symptoms.  Assessment and Plan:  1.  Dyspnea on exertion and palpitations as discussed above in a 50 year old male with history of COVID-19 back in December 2021, normal coronaries and LVEF in 2017, but also family history of premature CAD in his father and an enlarged heart in a sister who died suddenly in her  30s.  Recent ECG reviewed and normal.  Also reviewed lab work from earlier in the year.  Plan is to obtain a 72-hour Zio patch to better assess cardiac rhythm as symptoms are daily.  Also follow-up echocardiogram to reassess cardiac structure and function.  2.  Hyperlipidemia with history of statin intolerance.  LDL 180 in April.  Estimated 10-year cardiovascular risk approximately 6%.  Generally statin would be recommended for risk reduction, although with intolerance he could potentially consider Zetia or more effectively a PCSK9 inhibitor.  Medication Adjustments/Labs and Tests Ordered: Current medicines are reviewed at length with the patient today.  Concerns regarding medicines are outlined above.   Tests Ordered: Orders Placed This Encounter  Procedures   ECHOCARDIOGRAM COMPLETE     Medication Changes: No orders of the defined types were placed in this encounter.  Disposition:  Follow up  test results.  Signed, Jonelle Sidle, MD, [facility] 05/23/2021 9:31 AM    [facility] [address] [place], [place], [place] [address] Phone: [phone]; Fax: [phone]

## 2021-05-23 ENCOUNTER — Ambulatory Visit (INDEPENDENT_AMBULATORY_CARE_PROVIDER_SITE_OTHER): Payer: BC Managed Care – PPO

## 2021-05-23 ENCOUNTER — Ambulatory Visit (INDEPENDENT_AMBULATORY_CARE_PROVIDER_SITE_OTHER): Payer: BC Managed Care – PPO | Admitting: Cardiology

## 2021-05-23 ENCOUNTER — Other Ambulatory Visit: Payer: Self-pay | Admitting: Cardiology

## 2021-05-23 ENCOUNTER — Other Ambulatory Visit: Payer: Self-pay

## 2021-05-23 ENCOUNTER — Encounter: Payer: Self-pay | Admitting: Cardiology

## 2021-05-23 ENCOUNTER — Telehealth: Payer: Self-pay | Admitting: Cardiology

## 2021-05-23 VITALS — BP 140/82 | HR 96 | Ht 73.0 in | Wt 237.0 lb

## 2021-05-23 DIAGNOSIS — Z789 Other specified health status: Secondary | ICD-10-CM

## 2021-05-23 DIAGNOSIS — R002 Palpitations: Secondary | ICD-10-CM | POA: Diagnosis not present

## 2021-05-23 DIAGNOSIS — R0600 Dyspnea, unspecified: Secondary | ICD-10-CM

## 2021-05-23 DIAGNOSIS — R0609 Other forms of dyspnea: Secondary | ICD-10-CM

## 2021-05-23 DIAGNOSIS — E782 Mixed hyperlipidemia: Secondary | ICD-10-CM

## 2021-05-23 NOTE — Telephone Encounter (Signed)
Checking percert on the following patient for testing scheduled at [facility].     ECHO    06/09/2021  LONG TERM ZIO MONITOR

## 2021-05-23 NOTE — Patient Instructions (Addendum)
Medication Instructions:   Your physician recommends that you continue on your current medications as directed. Please refer to the Current Medication list given to you today.  Labwork:  none  Testing/Procedures: Your physician has requested that you have an echocardiogram. Echocardiography is a painless test that uses sound waves to create images of your heart. It provides your doctor with information about the size and shape of your heart and how well your heart's chambers and valves are working. This procedure takes approximately one hour. There are no restrictions for this procedure. ZIO- Long Term Monitor Instructions   Your physician has requested you wear your ZIO patch monitor 3 days.   This is a single patch monitor.  [employer] supplies one patch monitor per enrollment.  Additional stickers are not available.   Please do not apply patch if you will be having a Nuclear Stress Test, Echocardiogram, Cardiac CT, MRI, or Chest Xray during the time frame you would be wearing the monitor. The patch cannot be worn during these tests.  You cannot remove and re-apply the ZIO XT patch monitor.     Once you have received you monitor, please review enclosed instructions.  Your monitor has already been registered assigning a specific monitor serial # to you.   Applying the monitor   Shave hair from upper left chest.   Hold abrader disc by orange tab.  Rub abrader in 40 strokes over left upper chest as indicated in your monitor instructions.   Clean area with 4 enclosed alcohol pads .  Use all pads to assure are is cleaned thoroughly.  Let dry.   Apply patch as indicated in monitor instructions.  Patch will be place under collarbone on left side of chest with arrow pointing upward.   Rub patch adhesive wings for 2 minutes.Remove white label marked "1".  Remove white label marked "2".  Rub patch adhesive wings for 2 additional minutes.   While looking in a mirror, press and release button in  center of patch.  A small green light will flash 3-4 times .  This will be your only indicator the monitor has been turned on.     Do not shower for the first 24 hours.  You may shower after the first 24 hours.   Press button if you feel a symptom. You will hear a small click.  Record Date, Time and Symptom in the Patient Log Book.   When you are ready to remove patch, follow instructions on last 2 pages of Patient Log Book.  Stick patch monitor onto last page of Patient Log Book.   Place Patient Log Book in Blue box.  Use locking tab on box and tape box closed securely.  The Orange and White box has prepaid postage on it.  Please place in mailbox as soon as possible.  Your physician should have your test results approximately 7 days after the monitor has been mailed back to [employer].   Call [employer] Customer Care at [phone] if you have questions regarding your ZIO XT patch monitor.  Call them immediately if you see an orange light blinking on your monitor.    If your monitor falls off in less than 4 days contact our Monitor department at [phone].  If your monitor becomes loose or falls off after 4 days call [employer] at [phone] for suggestions on securing your monitor.  Follow-Up:  Your physician recommends that you schedule a follow-up appointment in: pending.  Any Other Special Instructions Will Be   Listed Below (If Applicable).  If you need a refill on your cardiac medications before your next appointment, please call your pharmacy. 

## 2021-06-01 ENCOUNTER — Telehealth: Payer: Self-pay | Admitting: *Deleted

## 2021-06-01 NOTE — Telephone Encounter (Signed)
Patient informed. Copy sent to PCP °

## 2021-06-01 NOTE — Telephone Encounter (Signed)
-----   Message from Jonelle Sidle, MD sent at 05/31/2021  9:53 AM EDT ----- Results reviewed.  Cardiac monitor was reassuring.  He had only rare atrial and ventricular ectopy, importantly no significant arrhythmias like atrial fibrillation.  Await results of echocardiogram.

## 2021-06-09 ENCOUNTER — Ambulatory Visit (HOSPITAL_COMMUNITY)
Admission: RE | Admit: 2021-06-09 | Discharge: 2021-06-09 | Disposition: A | Discharge status: Home / Self Care | Payer: BC Managed Care – PPO | Source: Ambulatory Visit | Attending: Cardiology | Admitting: Cardiology

## 2021-06-09 ENCOUNTER — Other Ambulatory Visit: Payer: Self-pay

## 2021-06-09 DIAGNOSIS — R0600 Dyspnea, unspecified: Secondary | ICD-10-CM | POA: Diagnosis not present

## 2021-06-09 DIAGNOSIS — R0609 Other forms of dyspnea: Secondary | ICD-10-CM | POA: Diagnosis not present

## 2021-06-09 LAB — ECHOCARDIOGRAM COMPLETE
AR max vel: 2.63 cm2
AV Area VTI: 2.91 cm2
AV Area mean vel: 2.36 cm2
AV Mean grad: 3 mmHg
AV Peak grad: 6.1 mmHg
Ao pk vel: 1.23 m/s
Area-P 1/2: 3.74 cm2
MV VTI: 4.46 cm2
S' Lateral: 2.5 cm

## 2021-06-09 NOTE — Progress Notes (Signed)
*  PRELIMINARY RESULTS* Echocardiogram 2D Echocardiogram has been performed.  Mario Jones 06/09/2021, 11:32 AM

## 2021-06-14 ENCOUNTER — Telehealth: Payer: Self-pay | Admitting: Cardiology

## 2021-06-14 NOTE — Telephone Encounter (Signed)
Patient informed and verbalized understanding of plan. Copy sent to PCP 

## 2021-06-14 NOTE — Telephone Encounter (Signed)
Patient informed and verbalized understanding of plan. 

## 2021-06-14 NOTE — Telephone Encounter (Signed)
Returning call for results-- please call [phone]

## 2021-06-14 NOTE — Telephone Encounter (Signed)
-----   Message from Jonelle Sidle, MD sent at 06/09/2021 12:43 PM EDT ----- Results reviewed.  Please let him know that cardiac function remains normal, LVEF 55 to 60%.  RV contraction also normal.  There is mild aortic regurgitation which would not be expected to be causing any particular symptoms.  With recent reassuring monitor, do not anticipate further cardiac testing now unless symptoms persist or escalate.

## 2021-06-19 ENCOUNTER — Ambulatory Visit (INDEPENDENT_AMBULATORY_CARE_PROVIDER_SITE_OTHER): Payer: BC Managed Care – PPO | Admitting: Gastroenterology

## 2021-06-19 ENCOUNTER — Other Ambulatory Visit: Payer: Self-pay

## 2021-06-19 ENCOUNTER — Encounter (INDEPENDENT_AMBULATORY_CARE_PROVIDER_SITE_OTHER): Payer: Self-pay | Admitting: Gastroenterology

## 2021-06-19 VITALS — BP 139/107 | HR 108 | Temp 97.9°F | Ht 73.0 in | Wt 232.9 lb

## 2021-06-19 DIAGNOSIS — K7581 Nonalcoholic steatohepatitis (NASH): Secondary | ICD-10-CM

## 2021-06-19 DIAGNOSIS — K589 Irritable bowel syndrome without diarrhea: Secondary | ICD-10-CM | POA: Insufficient documentation

## 2021-06-19 DIAGNOSIS — R140 Abdominal distension (gaseous): Secondary | ICD-10-CM | POA: Insufficient documentation

## 2021-06-19 DIAGNOSIS — Z8619 Personal history of other infectious and parasitic diseases: Secondary | ICD-10-CM

## 2021-06-19 DIAGNOSIS — K580 Irritable bowel syndrome with diarrhea: Secondary | ICD-10-CM | POA: Diagnosis not present

## 2021-06-19 DIAGNOSIS — R6881 Early satiety: Secondary | ICD-10-CM

## 2021-06-19 NOTE — Patient Instructions (Addendum)
Schedule EGD  Schedule gastric emptying study Perform blood workup Start IBGard 1 tablet every 6-8 hours as needed for bloating. Can try edible peppermint oil instead as well

## 2021-06-19 NOTE — Progress Notes (Signed)
Katrinka Blazing, M.D. Gastroenterology & Hepatology [facility] [address] [place], [place] [address]  Primary Care Physician: Lovey Newcomer, [place] [address] [place] [address]  I will communicate my assessment and recommendations to the referring MD via EMR.  Problems: NASH Post infectious IBS Recurrent bloating history of giardiasis  History of Present Illness: Mario Jones is a 50 y.o. male with past medical history of GERD, history of giardiasis, hyperlipidemia, who presents for follow up of RUQ abdominal pain and bloating.  The patient was last seen on 01/23/2021. At that time, the patient had blood workup for elevated LFTs , which included negative viral hepatitis, normal iron stores, low ANA titer +, neg markers for AIH, Wilson's, rhabdomyolisis and celiac disease. Liver US was positive for steatosis.  Patient reports that he has presented for the last year persistent bloating after eating any meals and gets full very easily. Is trying to follow a Mediterranean diet as much as possible for his NASH but he reports that it is hard for him to follow a diet. Is having 2 Bms per day. Takes 1 Imodium per day sometimes . Stool is loose most of the times without presence of watery diarrhea.  The patient denies having any nausea, vomiting, fever, chills, hematochezia, melena, hematemesis, abdominal pain, diarrhea, jaundice, pruritus. Believes has lost 2-3 lb since the last time he was seen in the office based on weights she has checked at home.  Notably, the patient was told that he had a diagnosis of gastroparesis as he had a previous gastric emptying study from 08/16/2014 which showed delayed gastric emptying with 47% retention at 120 minutes. He was taking opiates at the time this was performed.  Last EGD: 05/08/2013 - normal per medical record Last Colonoscopy: 2022 - The entire examined colon is normal. Biopsied, neg for  microscopic colitis - The distal rectum and anal verge are normal on retroflexion view.  Past Medical History: Past Medical History:  Diagnosis Date   COVID-04 September 2020   GERD (gastroesophageal reflux disease)    History of cardiac catheterization    Normal coronaries April 2017   Hyperlipidemia    Osteoarthritis    Seasonal allergies    Statin intolerance     Past Surgical History: Past Surgical History:  Procedure Laterality Date   ARTHROSCOPY KNEE W/ DRILLING     BIOPSY  02/14/2021   Procedure: BIOPSY;  Surgeon: Dolores Frame, MD;  [facility];  Service: Gastroenterology;;   CARDIAC CATHETERIZATION N/A 12/22/2015   Procedure: Left Heart Cath and Coronary Angiography;  Surgeon: Tonny Bollman, MD;  Location: [facility] INVASIVE CV LAB;  Service: Cardiovascular;  Laterality: N/A;   CHOLECYSTECTOMY     COLONOSCOPY     COLONOSCOPY WITH PROPOFOL N/A 02/14/2021   Procedure: COLONOSCOPY WITH PROPOFOL;  Surgeon: Dolores Frame, MD;  [facility];  Service: Gastroenterology;  Laterality: N/A;  8:15   INGUINAL HERNIA REPAIR     KNEE ARTHROSCOPY W/ ACL RECONSTRUCTION     UPPER GASTROINTESTINAL ENDOSCOPY      Family History: Family History  Problem Relation Age of Onset   Migraines Mother    Autoimmune disease Mother    Heart disease Father        Age 16s   Healthy Son    Asthma Son    Heart disease Sister        Age 62 - sudden cardiac death  Social History: Social History   Tobacco Use  Smoking Status Never  Smokeless Tobacco Former   Types: Chew   Quit date: 08/09/1989   Social History   Substance and Sexual Activity  Alcohol Use No   Alcohol/week: 0.0 standard drinks   Social History   Substance and Sexual Activity  Drug Use No    Allergies: Allergies  Allergen Reactions   Gabapentin Rash   Morphine And Related Nausea And Vomiting   Statins Other (See Comments)    Fatigue/muscle & joint pain     Medications: Current Outpatient Medications  Medication Sig Dispense Refill   acetaminophen (TYLENOL) 650 MG CR tablet Take 650 mg by mouth every 8 (eight) hours as needed for pain.     Cholecalciferol (VITAMIN D3) 50 MCG (2000 UT) TABS Take 2,000 Units by mouth in the morning.     famotidine (PEPCID) 20 MG tablet Take 20 mg by mouth 2 (two) times daily as needed for heartburn or indigestion.     ibuprofen (ADVIL,MOTRIN) 200 MG tablet Take 400 mg by mouth every 8 (eight) hours as needed (pain.).     Ibuprofen-diphenhydrAMINE Cit (ADVIL PM) 200-38 MG TABS Take 1 tablet by mouth at bedtime.     loperamide (IMODIUM) 2 MG capsule Take 2-4 mg by mouth 4 (four) times daily as needed for diarrhea or loose stools.     melatonin 5 MG TABS Take 5 mg by mouth at bedtime.     pantoprazole (PROTONIX) 40 MG tablet Take 40 mg by mouth in the morning.     No current facility-administered medications for this visit.    Review of Systems: GENERAL: negative for malaise, night sweats HEENT: No changes in hearing or vision, no nose bleeds or other nasal problems. NECK: Negative for lumps, goiter, pain and significant neck swelling RESPIRATORY: Negative for cough, wheezing CARDIOVASCULAR: Negative for chest pain, leg swelling, palpitations, orthopnea GI: SEE HPI MUSCULOSKELETAL: Negative for joint pain or swelling, back pain, and muscle pain. SKIN: Negative for lesions, rash PSYCH: Negative for sleep disturbance, mood disorder and recent psychosocial stressors. HEMATOLOGY Negative for prolonged bleeding, bruising easily, and swollen nodes. ENDOCRINE: Negative for cold or heat intolerance, polyuria, polydipsia and goiter. NEURO: negative for tremor, gait imbalance, syncope and seizures. The remainder of the review of systems is noncontributory.   Physical Exam: BP (!) 139/107 (BP Location: Left Arm, Patient Position: Sitting, Cuff Size: Large)   Pulse (!) 108   Temp 97.9 F (36.6 C) (Oral)   Ht 6'  1" (1.854 m)   Wt 232 lb 14.4 oz (105.6 kg)   BMI 30.73 kg/m  GENERAL: The patient is AO x3, in no acute distress. HEENT: Head is normocephalic and atraumatic. EOMI are intact. Mouth is well hydrated and without lesions. NECK: Supple. No masses LUNGS: Clear to auscultation. No presence of rhonchi/wheezing/rales. Adequate chest expansion HEART: RRR, normal s1 and s2. ABDOMEN: Soft, nontender, no guarding, no peritoneal signs, and nondistended. BS +. No masses. EXTREMITIES: Without any cyanosis, clubbing, rash, lesions or edema. NEUROLOGIC: AOx3, no focal motor deficit. SKIN: no jaundice, no rashes  Imaging/Labs: as above  I personally reviewed and interpreted the available labs, imaging and endoscopic files.  Impression and Plan: Mario Jones is a 50 y.o. male with past medical history of GERD, history of giardiasis, hyperlipidemia, who presents for follow up of RUQ abdominal pain and bloating.  Overall, the patient has presented improvement of his abdominal pain episodes and diarrhea, but has persisted with bloating  episodes.  It is very likely that the symptoms are related to a postinfectious IBS, but we will need to evaluate any other intra luminal etiologies with an EGD.  We will also check a celiac panel today.  He will benefit from using peppermint based products to improve his bloating episodes.  Given the concomitant presence of early satiety, a gastric emptying study will be repeated now he is off opiates to evaluate for gastroparesis.  Finally, we will recheck a repeat CMP to determine if he has presented any improvement in his liver enzymes.  - Schedule EGD  - Schedule gastric emptying study -Repeat CMP and celiac disease panel - Start IBGard 1 tablet every 6-8 hours as needed for bloating. Can try edible peppermint oil instead as well  All questions were answered.      Dolores Frame, MD Gastroenterology and Hepatology [facility]

## 2021-06-20 ENCOUNTER — Other Ambulatory Visit (INDEPENDENT_AMBULATORY_CARE_PROVIDER_SITE_OTHER): Payer: Self-pay

## 2021-06-20 LAB — COMPREHENSIVE METABOLIC PANEL
AG Ratio: 1.8 (calc) (ref 1.0–2.5)
ALT: 129 U/L — ABNORMAL HIGH (ref 9–46)
AST: 58 U/L — ABNORMAL HIGH (ref 10–40)
Albumin: 5 g/dL (ref 3.6–5.1)
Alkaline phosphatase (APISO): 77 U/L (ref 36–130)
BUN: 15 mg/dL (ref 7–25)
CO2: 26 mmol/L (ref 20–32)
Calcium: 10.6 mg/dL — ABNORMAL HIGH (ref 8.6–10.3)
Chloride: 100 mmol/L (ref 98–110)
Creat: 1.01 mg/dL (ref 0.60–1.29)
Globulin: 2.8 g/dL (calc) (ref 1.9–3.7)
Glucose, Bld: 79 mg/dL (ref 65–99)
Potassium: 4.7 mmol/L (ref 3.5–5.3)
Sodium: 138 mmol/L (ref 135–146)
Total Bilirubin: 1.1 mg/dL (ref 0.2–1.2)
Total Protein: 7.8 g/dL (ref 6.1–8.1)

## 2021-06-20 LAB — CELIAC DISEASE PANEL
(tTG) Ab, IgA: <1 U/mL
(tTG) Ab, IgG: <1 U/mL
Gliadin IgA: 4.4 U/mL
Gliadin IgG: <1 U/mL
Immunoglobulin A: 325 mg/dL — ABNORMAL HIGH (ref 47–310)

## 2021-06-21 ENCOUNTER — Encounter (INDEPENDENT_AMBULATORY_CARE_PROVIDER_SITE_OTHER): Payer: Self-pay

## 2021-06-22 ENCOUNTER — Encounter (HOSPITAL_COMMUNITY): Payer: BC Managed Care – PPO

## 2021-07-04 ENCOUNTER — Other Ambulatory Visit: Payer: Self-pay

## 2021-07-04 ENCOUNTER — Encounter (HOSPITAL_COMMUNITY): Payer: Self-pay

## 2021-07-04 ENCOUNTER — Ambulatory Visit (HOSPITAL_COMMUNITY)
Admission: RE | Admit: 2021-07-04 | Discharge: 2021-07-04 | Disposition: A | Discharge status: Home / Self Care | Payer: BC Managed Care – PPO | Source: Ambulatory Visit | Attending: Gastroenterology | Admitting: Gastroenterology

## 2021-07-04 DIAGNOSIS — R6881 Early satiety: Secondary | ICD-10-CM

## 2021-07-04 DIAGNOSIS — R140 Abdominal distension (gaseous): Secondary | ICD-10-CM | POA: Diagnosis present

## 2021-07-04 MED ORDER — TECHNETIUM TC 99M SULFUR COLLOID
2.0000 | Freq: Once | INTRAVENOUS | Status: AC | PRN
  Administered 2021-07-04: 2.2 via ORAL

## 2021-07-06 ENCOUNTER — Other Ambulatory Visit: Payer: BC Managed Care – PPO

## 2021-07-24 ENCOUNTER — Encounter (INDEPENDENT_AMBULATORY_CARE_PROVIDER_SITE_OTHER): Payer: Self-pay

## 2021-07-26 ENCOUNTER — Other Ambulatory Visit (HOSPITAL_COMMUNITY): Payer: BC Managed Care – PPO

## 2021-07-28 ENCOUNTER — Encounter (HOSPITAL_COMMUNITY): Payer: Self-pay | Admitting: Gastroenterology

## 2021-07-28 ENCOUNTER — Ambulatory Visit (HOSPITAL_COMMUNITY): Payer: BC Managed Care – PPO | Admitting: Anesthesiology

## 2021-07-28 ENCOUNTER — Telehealth (INDEPENDENT_AMBULATORY_CARE_PROVIDER_SITE_OTHER): Payer: Self-pay

## 2021-07-28 ENCOUNTER — Encounter (HOSPITAL_COMMUNITY)
Admission: RE | Disposition: A | Discharge status: Home / Self Care | Payer: Self-pay | Source: Home / Self Care | Attending: Gastroenterology

## 2021-07-28 ENCOUNTER — Ambulatory Visit (HOSPITAL_COMMUNITY)
Admission: RE | Admit: 2021-07-28 | Discharge: 2021-07-28 | Disposition: A | Discharge status: Home / Self Care | Payer: BC Managed Care – PPO | Attending: Gastroenterology | Admitting: Gastroenterology

## 2021-07-28 ENCOUNTER — Other Ambulatory Visit: Payer: Self-pay

## 2021-07-28 DIAGNOSIS — R1011 Right upper quadrant pain: Secondary | ICD-10-CM | POA: Diagnosis not present

## 2021-07-28 DIAGNOSIS — R140 Abdominal distension (gaseous): Secondary | ICD-10-CM | POA: Diagnosis not present

## 2021-07-28 DIAGNOSIS — E785 Hyperlipidemia, unspecified: Secondary | ICD-10-CM | POA: Insufficient documentation

## 2021-07-28 DIAGNOSIS — K219 Gastro-esophageal reflux disease without esophagitis: Secondary | ICD-10-CM | POA: Insufficient documentation

## 2021-07-28 DIAGNOSIS — M1990 Unspecified osteoarthritis, unspecified site: Secondary | ICD-10-CM | POA: Diagnosis not present

## 2021-07-28 DIAGNOSIS — K317 Polyp of stomach and duodenum: Secondary | ICD-10-CM

## 2021-07-28 HISTORY — PX: BIOPSY: SHX5522

## 2021-07-28 HISTORY — PX: ESOPHAGOGASTRODUODENOSCOPY (EGD) WITH PROPOFOL: SHX5813

## 2021-07-28 SURGERY — ESOPHAGOGASTRODUODENOSCOPY (EGD) WITH PROPOFOL
Anesthesia: General

## 2021-07-28 MED ORDER — LACTATED RINGERS IV SOLN
INTRAVENOUS | Status: DC
  Administered 2021-07-28: 1000 mL via INTRAVENOUS

## 2021-07-28 MED ORDER — HYOSCYAMINE SULFATE 0.125 MG SL SUBL: 0.1250 mg | SUBLINGUAL_TABLET | Freq: Four times a day (QID) | SUBLINGUAL | 3 refills | Status: DC | PRN

## 2021-07-28 MED ORDER — PROPOFOL 500 MG/50ML IV EMUL
INTRAVENOUS | Status: DC | PRN
  Administered 2021-07-28: 150 ug/kg/min via INTRAVENOUS

## 2021-07-28 NOTE — Op Note (Signed)
[facility] Patient Name: Mario Jones Procedure Date: 07/28/2021 7:17 AM MRN: [id number] Date of Birth: 10-27-1970 Attending MD: Katrinka Blazing ,  CSN: [id number] Age: 50 Admit Type: Outpatient Procedure:                Upper GI endoscopy Indications:              Abdominal bloating Providers:                Katrinka Blazing, Edrick Kins, RN, Kristine L.                            Jessee Avers, Technician Referring MD:              Medicines:                Monitored Anesthesia Care Complications:            No immediate complications. Estimated Blood Loss:     Estimated blood loss: none. Procedure:                Pre-Anesthesia Assessment:                           - Prior to the procedure, a History and Physical                            was performed, and patient medications, allergies                            and sensitivities were reviewed. The patient's                            tolerance of previous anesthesia was reviewed.                           - The risks and benefits of the procedure and the                            sedation options and risks were discussed with the                            patient. All questions were answered and informed                            consent was obtained.                           After obtaining informed consent, the endoscope was                            passed under direct vision. Throughout the                            procedure, the patient's blood pressure, pulse, and                            oxygen saturations were monitored continuously. The  GIF-H190 ([id number]) scope was introduced through the                            mouth, and advanced to the second part of duodenum.                            The upper GI endoscopy was accomplished without                            difficulty. The patient tolerated the procedure                            well. Scope In: 7:41:14 AM Scope  Out: 7:47:44 AM Total Procedure Duration: 0 hours 6 minutes 30 seconds  Findings:      The examined esophagus was normal.      The Z-line was regular and was found 40 cm from the incisors.      Multiple 3 to 5 mm sessile fundic gland polyps were found in the gastric       fundus and in the gastric body.      The examined duodenum was normal. Biopsies were taken with a cold       forceps for histology. Impression:               - Normal esophagus.                           - Z-line regular, 40 cm from the incisors.                           - Multiple fundic gland polyps.                           - Normal examined duodenum. Biopsied. Moderate Sedation:      Per Anesthesia Care Recommendation:           - Discharge patient to home (ambulatory).                           - Resume previous diet.                           - Await pathology results.                           - Start Levsin as needed for abdominal bloating.                           - May consider Xifaxan trial if diarrhea is present. Procedure Code(s):        --- Professional ---                           [phone], Esophagogastroduodenoscopy, flexible,                            transoral; with biopsy, single or multiple Diagnosis Code(s):        --- Professional ---  K31.7, Polyp of stomach and duodenum                           R14.0, Abdominal distension (gaseous) CPT copyright 2019 American Medical Association. All rights reserved. The codes documented in this report are preliminary and upon coder review may  be revised to meet current compliance requirements. Katrinka Blazing, MD Katrinka Blazing,  07/28/2021 7:54:43 AM This report has been signed electronically. Number of Addenda: 0

## 2021-07-28 NOTE — Anesthesia Preprocedure Evaluation (Addendum)
Anesthesia Evaluation  Patient identified by MRN, date of birth, ID band Patient awake    Reviewed: Allergy & Precautions, NPO status , Patient's Chart, lab work & pertinent test results  History of Anesthesia Complications Negative for: history of anesthetic complications  Airway Mallampati: I  TM Distance: >3 FB Neck ROM: Full   Comment: Chronic neck pain Dental  (+) Dental Advisory Given Crowns :   Pulmonary neg pulmonary ROS,    Pulmonary exam normal breath sounds clear to auscultation       Cardiovascular + angina with exertion Normal cardiovascular exam Rhythm:Regular Rate:Normal     Neuro/Psych negative neurological ROS  negative psych ROS   GI/Hepatic GERD  Medicated,(+) Hepatitis -  Endo/Other  negative endocrine ROS  Renal/GU negative Renal ROS     Musculoskeletal  (+) Arthritis ,   Abdominal   Peds  Hematology negative hematology ROS (+)   Anesthesia Other Findings Chronic neck and back pain  Reproductive/Obstetrics                            Anesthesia Physical Anesthesia Plan  ASA: 2  Anesthesia Plan: General   Post-op Pain Management:    Induction: Intravenous  PONV Risk Score and Plan: TIVA  Airway Management Planned: Nasal Cannula and Natural Airway  Additional Equipment:   Intra-op Plan:   Post-operative Plan:   Informed Consent: I have reviewed the patients History and Physical, chart, labs and discussed the procedure including the risks, benefits and alternatives for the proposed anesthesia with the patient or authorized representative who has indicated his/her understanding and acceptance.     Dental advisory given  Plan Discussed with: CRNA and Surgeon  Anesthesia Plan Comments:         Anesthesia Quick Evaluation

## 2021-07-28 NOTE — Anesthesia Postprocedure Evaluation (Signed)
Anesthesia Post Note  Patient: Mario Jones  Procedure(s) Performed: ESOPHAGOGASTRODUODENOSCOPY (EGD) WITH PROPOFOL BIOPSY  Patient location during evaluation: Endoscopy Anesthesia Type: General Level of consciousness: awake and alert and oriented Pain management: pain level controlled Vital Signs Assessment: post-procedure vital signs reviewed and stable Respiratory status: spontaneous breathing, nonlabored ventilation and respiratory function stable Cardiovascular status: blood pressure returned to baseline and stable Postop Assessment: no apparent nausea or vomiting Anesthetic complications: no   No notable events documented.   Last Vitals:  Vitals:   07/28/21 0651 07/28/21 0750  BP: (!) 153/96 (!) 106/58  Pulse: 66 77  Resp: 10 19  Temp: 36.9 C 36.4 C  SpO2: 97% 93%    Last Pain:  Vitals:   07/28/21 0750  TempSrc: Oral  PainSc: 0-No pain                 Lathan Gieselman C Fareeda Downard

## 2021-07-28 NOTE — Telephone Encounter (Signed)
I left a detailed message on Patient voice mail that insurance would not cover he would have to pay cash price or $10.00, and that I had asked the pharmacy to fill it. I advised if he had any questions or concerns regarding to call the office.   Received a Auth on Hyoscyamine 0.125 mg sublingual from [facility]. Per FDA insurance will not cover. I called [facility] out of pocket cost for medication will be $10.00. I advised them to go ahead and fill the medication with out insurance coverage and I would make the patient aware.

## 2021-07-28 NOTE — H&P (Signed)
Mario Jones is an 50 y.o. male.   Chief Complaint: bloating HPI: Mario Jones is a 50 y.o. male with past medical history of GERD, history of giardiasis, hyperlipidemia, who presents for follow up of RUQ abdominal pain and bloating.  Patient stated that he has persisted with bloating.  This has slightly improved with use IBgard but did discuss cost heartburn.  Denies any diarrhea, dental pain, nausea or vomiting.  Past Medical History:  Diagnosis Date   COVID-04 September 2020   GERD (gastroesophageal reflux disease)    History of cardiac catheterization    Normal coronaries April 2017   Hyperlipidemia    Osteoarthritis    Seasonal allergies    Statin intolerance     Past Surgical History:  Procedure Laterality Date   ARTHROSCOPY KNEE W/ DRILLING     BIOPSY  02/14/2021   Procedure: BIOPSY;  Surgeon: Dolores Frame, MD;  [facility];  Service: Gastroenterology;;   CARDIAC CATHETERIZATION N/A 12/22/2015   Procedure: Left Heart Cath and Coronary Angiography;  Surgeon: Tonny Bollman, MD;  Location: [facility] INVASIVE CV LAB;  Service: Cardiovascular;  Laterality: N/A;   CHOLECYSTECTOMY     COLONOSCOPY     COLONOSCOPY WITH PROPOFOL N/A 02/14/2021   Procedure: COLONOSCOPY WITH PROPOFOL;  Surgeon: Dolores Frame, MD;  [facility];  Service: Gastroenterology;  Laterality: N/A;  8:15   INGUINAL HERNIA REPAIR     KNEE ARTHROSCOPY W/ ACL RECONSTRUCTION     UPPER GASTROINTESTINAL ENDOSCOPY      Family History  Problem Relation Age of Onset   Migraines Mother    Autoimmune disease Mother    Heart disease Father        Age 37s   Healthy Son    Asthma Son    Heart disease Sister        Age 54 - sudden cardiac death   Social History:  reports that he has never smoked. He quit smokeless tobacco use about 31 years ago.  His smokeless tobacco use included chew. He reports that he does not drink alcohol and does not use drugs.  Allergies:   Allergies  Allergen Reactions   Gabapentin Rash   Morphine And Related Nausea And Vomiting   Statins Other (See Comments)    Fatigue/muscle & joint pain    Medications Prior to Admission  Medication Sig Dispense Refill   cholecalciferol (VITAMIN D) 25 MCG (1000 UNIT) tablet Take 1,000 Units by mouth in the morning.     ibuprofen (ADVIL,MOTRIN) 200 MG tablet Take 400 mg by mouth every 8 (eight) hours as needed (pain.).     Ibuprofen-diphenhydrAMINE Cit (ADVIL PM) 200-38 MG TABS Take 1 tablet by mouth at bedtime.     loperamide (IMODIUM) 2 MG capsule Take 2-4 mg by mouth 4 (four) times daily as needed for diarrhea or loose stools.     Melatonin 10 MG TABS Take 10 mg by mouth at bedtime.     pantoprazole (PROTONIX) 40 MG tablet Take 40 mg by mouth in the morning.     acetaminophen (TYLENOL) 650 MG CR tablet Take 650 mg by mouth every 8 (eight) hours as needed for pain.      No results found for this or any previous visit (from the past 48 hour(s)). No results found.  Review of Systems  Constitutional: Negative.   HENT: Negative.    Eyes: Negative.   Respiratory: Negative.    Cardiovascular: Negative.  Gastrointestinal:  Positive for abdominal distention.  Endocrine: Negative.   Genitourinary: Negative.   Musculoskeletal: Negative.   Skin: Negative.   Allergic/Immunologic: Negative.   Neurological: Negative.   Hematological: Negative.   Psychiatric/Behavioral: Negative.     Blood pressure (!) 153/96, pulse 66, temperature 98.5 F (36.9 C), temperature source Oral, resp. rate 10, height 6\' 1"  (1.854 m), weight 98.9 kg, SpO2 97 %. Physical Exam  GENERAL: The patient is AO x3, in no acute distress. HEENT: Head is normocephalic and atraumatic. EOMI are intact. Mouth is well hydrated and without lesions. NECK: Supple. No masses LUNGS: Clear to auscultation. No presence of rhonchi/wheezing/rales. Adequate chest expansion HEART: RRR, normal s1 and s2. ABDOMEN: Soft, nontender,  no guarding, no peritoneal signs, and nondistended. BS +. No masses. EXTREMITIES: Without any cyanosis, clubbing, rash, lesions or edema. NEUROLOGIC: AOx3, no focal motor deficit. SKIN: no jaundice, no rashes  Assessment/Plan Mario Jones is a 50 y.o. male with past medical history of GERD, history of giardiasis, hyperlipidemia, who presents for follow up of RUQ abdominal pain and bloating.  We will proceed with EGD with small bowel biopsies.  55, MD 07/28/2021, 7:34 AM

## 2021-07-28 NOTE — Discharge Instructions (Addendum)
You are being discharged to home.  Resume your previous diet.  We are waiting for your pathology results. Start Levsin as needed for abdominal bloating.   PATIENT INSTRUCTIONS POST-ANESTHESIA  IMMEDIATELY FOLLOWING SURGERY:  Do not drive or operate machinery for the first twenty four hours after surgery.  Do not make any important decisions for twenty four hours after surgery or while taking narcotic pain medications or sedatives.  If you develop intractable nausea and vomiting or a severe headache please notify your doctor immediately.  FOLLOW-UP:  Please make an appointment with your surgeon as instructed. You do not need to follow up with anesthesia unless specifically instructed to do so.  WOUND CARE INSTRUCTIONS (if applicable):  Keep a dry clean dressing on the anesthesia/puncture wound site if there is drainage.  Once the wound has quit draining you may leave it open to air.  Generally you should leave the bandage intact for twenty four hours unless there is drainage.  If the epidural site drains for more than 36-48 hours please call the anesthesia department.  QUESTIONS?:  Please feel free to call your physician or the hospital operator if you have any questions, and they will be happy to assist you.

## 2021-07-28 NOTE — Transfer of Care (Signed)
Immediate Anesthesia Transfer of Care Note  Patient: Mario Jones  Procedure(s) Performed: ESOPHAGOGASTRODUODENOSCOPY (EGD) WITH PROPOFOL BIOPSY  Patient Location: PACU  Anesthesia Type:General  Level of Consciousness: awake, alert , oriented and patient cooperative  Airway & Oxygen Therapy: Patient Spontanous Breathing  Post-op Assessment: Report given to RN, Post -op Vital signs reviewed and stable and Patient moving all extremities X 4  Post vital signs: Reviewed and stable  Last Vitals:  Vitals Value Taken Time  BP 106/58 07/28/21 0750  Temp 36.4 C 07/28/21 0750  Pulse 77 07/28/21 0750  Resp 19 07/28/21 0750  SpO2 93 % 07/28/21 0750    Last Pain:  Vitals:   07/28/21 0750  TempSrc: Oral  PainSc: 0-No pain      Patients Stated Pain Goal: 6 (07/28/21 0651)  Complications: No notable events documented.

## 2021-07-31 LAB — SURGICAL PATHOLOGY

## 2021-08-02 ENCOUNTER — Encounter (HOSPITAL_COMMUNITY): Payer: Self-pay | Admitting: Gastroenterology

## 2021-12-21 ENCOUNTER — Ambulatory Visit (INDEPENDENT_AMBULATORY_CARE_PROVIDER_SITE_OTHER): Payer: BC Managed Care – PPO | Admitting: Gastroenterology

## 2022-01-04 ENCOUNTER — Encounter (INDEPENDENT_AMBULATORY_CARE_PROVIDER_SITE_OTHER): Payer: Self-pay | Admitting: Gastroenterology

## 2022-01-04 ENCOUNTER — Ambulatory Visit (INDEPENDENT_AMBULATORY_CARE_PROVIDER_SITE_OTHER): Payer: BC Managed Care – PPO | Admitting: Gastroenterology

## 2022-01-04 VITALS — BP 117/83 | HR 91 | Temp 98.2°F | Ht 73.0 in | Wt 226.5 lb

## 2022-01-04 DIAGNOSIS — K7581 Nonalcoholic steatohepatitis (NASH): Secondary | ICD-10-CM | POA: Diagnosis not present

## 2022-01-04 DIAGNOSIS — R140 Abdominal distension (gaseous): Secondary | ICD-10-CM

## 2022-01-04 DIAGNOSIS — K580 Irritable bowel syndrome with diarrhea: Secondary | ICD-10-CM

## 2022-01-04 NOTE — Patient Instructions (Addendum)
Continue Mediterranean diet ?Continue working on weight loss ?Continue peppermint as needed for bloating ?Can continue probiotics if feeling that the symptoms are better with this medicine ?Perform blood workup ? ?

## 2022-01-04 NOTE — Progress Notes (Signed)
Katrinka Blazing, M.D. ?Gastroenterology & Hepatology ?[facility] ?[address] ?[place], [place] [address] ? ?Primary Care Physician: ?Lovey Newcomer, PA ?[address] ?[place] [address] ? ?I will communicate my assessment and recommendations to the referring MD via EMR. ? 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 ?History of Present Illness: ?Mario Jones is a 51 y.o. male with past medical history of GERD, history of giardiasis, hyperlipidemia, who presents for follow up of bloating. ? ?The patient was last seen on 06/19/2022. At that time, the patient was scheduled for esophagogastroduodenospy and gastric emptying study for evaluation of bloating.  Had repeat CMP that showed an ALT of 129, AST 58, and celiac disease panel which came back normal.  He was advised to take IBgard as needed for bloating. ? ?Patient reports that depending on what he eats he may still have bloating episodes. Has noticed that "Taco Tuesday" and spicy food causes significant bloating.  Has tried to implement the Mediterranean diet as much as possible but it is hard to completely appear to it.  States that when he takes peppermint, he feels improvement the next morning. Has been taking a probiotic for the last 4 weeks, but does not know if this has improved his symptoms. ? ?Was on antibiotics recently for a sinus infection, now he reports feeling better.  Due to this he started taking a probiotic in the past. ? ?The patient denies having any nausea, vomiting, fever, chills, hematochezia, melena, hematemesis, abdominal distention, abdominal pain, diarrhea, jaundice, pruritus . Has lost 6 lb since last appointment as he has been more active. ? ?Last EGD: 07/2021 ?- Normal esophagus. ?- Z-line regular, 40 cm from the incisors. ?- Multiple fundic gland polyps. ?- Normal examined duodenum. Biopsied, normal duodenal mucosa ?Last  Colonoscopy: ?2022 - The entire examined colon is normal. Biopsied, neg for microscopic colitis ?- The distal rectum and anal verge are normal on retroflexion view. ? ?Past Medical History: ?Past Medical History:  ?Diagnosis Date  ? COVID-19   ? December 2021  ? GERD (gastroesophageal reflux disease)   ? History of cardiac catheterization   ? Normal coronaries April 2017  ? Hyperlipidemia   ? Osteoarthritis   ? Seasonal allergies   ? Statin intolerance   ? ? ?Past Surgical History: ?Past Surgical History:  ?Procedure Laterality Date  ? ARTHROSCOPY KNEE W/ DRILLING    ? BIOPSY  02/14/2021  ? Procedure: BIOPSY;  Surgeon: Dolores Frame, MD;  [facility];  Service: Gastroenterology;;  ? BIOPSY  07/28/2021  ? Procedure: BIOPSY;  Surgeon: Dolores Frame, MD;  [facility];  Service: Gastroenterology;;  ? CARDIAC CATHETERIZATION N/A 12/22/2015  ? Procedure: Left Heart Cath and Coronary Angiography;  Surgeon: Tonny Bollman, MD;  Location: [facility] INVASIVE CV LAB;  Service: Cardiovascular;  Laterality: N/A;  ? CHOLECYSTECTOMY    ? COLONOSCOPY    ? COLONOSCOPY WITH PROPOFOL N/A 02/14/2021  ? Procedure: COLONOSCOPY WITH PROPOFOL;  Surgeon: Dolores Frame, MD;  [facility];  Service: Gastroenterology;  Laterality: N/A;  8:15  ? ESOPHAGOGASTRODUODENOSCOPY (EGD) WITH PROPOFOL N/A 07/28/2021  ? Procedure: ESOPHAGOGASTRODUODENOSCOPY (EGD) WITH PROPOFOL;  Surgeon: Dolores Frame, MD;  [facility];  Service: Gastroenterology;  Laterality: N/A;  7:30  ? INGUINAL HERNIA REPAIR    ? KNEE ARTHROSCOPY W/ ACL RECONSTRUCTION    ? UPPER GASTROINTESTINAL ENDOSCOPY    ? ? ?Family  History: ?Family History  ?Problem Relation Age of Onset  ? Migraines Mother   ? Autoimmune disease Mother   ? Heart disease Father   ?     Age 42s  ? Healthy Son   ? Asthma Son   ? Heart disease Sister   ?     Age 79 - sudden cardiac death  ? ? ?Social History: ?Social History   ? ?Tobacco Use  ?Smoking Status Never  ?Smokeless Tobacco Former  ? Types: Chew  ? Quit date: 08/09/1989  ? ?Social History  ? ?Substance and Sexual Activity  ?Alcohol Use No  ? Alcohol/week: 0.0 standard drinks  ? ?Social History  ? ?Substance and Sexual Activity  ?Drug Use No  ? ? ?Allergies: ?Allergies  ?Allergen Reactions  ? Gabapentin Rash  ? Morphine And Related Nausea And Vomiting  ? Statins Other (See Comments)  ?  Fatigue/muscle & joint pain  ? ? ?Medications: ?Current Outpatient Medications  ?Medication Sig Dispense Refill  ? acetaminophen (TYLENOL) 650 MG CR tablet Take 650 mg by mouth every 8 (eight) hours as needed for pain.    ? cholecalciferol (VITAMIN D) 25 MCG (1000 UNIT) tablet Take 1,000 Units by mouth in the morning.    ? ibuprofen (ADVIL,MOTRIN) 200 MG tablet Take 400 mg by mouth every 8 (eight) hours as needed (pain.).    ? Ibuprofen-diphenhydrAMINE Cit (ADVIL PM) 200-38 MG TABS Take 1 tablet by mouth at bedtime.    ? loperamide (IMODIUM) 2 MG capsule Take 2-4 mg by mouth 4 (four) times daily as needed for diarrhea or loose stools.    ? Melatonin 10 MG TABS Take 10 mg by mouth at bedtime.    ? pantoprazole (PROTONIX) 40 MG tablet Take 40 mg by mouth in the morning.    ? propranolol ER (INDERAL LA) 60 MG 24 hr capsule Take 60 mg by mouth daily.    ? hyoscyamine (LEVSIN SL) 0.125 MG SL tablet Place 1 tablet (0.125 mg total) under the tongue every 6 (six) hours as needed (bloating). (Patient not taking: Reported on 01/04/2022) 30 tablet 3  ? ?No current facility-administered medications for this visit.  ? ? ?Review of Systems: ?GENERAL: negative for malaise, night sweats ?HEENT: No changes in hearing or vision, no nose bleeds or other nasal problems. ?NECK: Negative for lumps, goiter, pain and significant neck swelling ?RESPIRATORY: Negative for cough, wheezing ?CARDIOVASCULAR: Negative for chest pain, leg swelling, palpitations, orthopnea ?GI: SEE HPI ?MUSCULOSKELETAL: Negative for joint pain or  swelling, back pain, and muscle pain. ?SKIN: Negative for lesions, rash ?PSYCH: Negative for sleep disturbance, mood disorder and recent psychosocial stressors. ?HEMATOLOGY Negative for prolonged bleeding, bruising easily, and swollen nodes. ?ENDOCRINE: Negative for cold or heat intolerance, polyuria, polydipsia and goiter. ?NEURO: negative for tremor, gait imbalance, syncope and seizures. ?The remainder of the review of systems is noncontributory. ? ? ?Physical Exam: ?BP 117/83 (BP Location: Right Arm, Patient Position: Sitting, Cuff Size: Large)   Pulse 91   Temp 98.2 ?F (36.8 ?C) (Oral)   Ht 6\' 1"  (1.854 m)   Wt 226 lb 8 oz (102.7 kg)   BMI 29.88 kg/m?  ?GENERAL: The patient is AO x3, in no acute distress. Overweight. ?HEENT: Head is normocephalic and atraumatic. EOMI are intact. Mouth is well hydrated and without lesions. ?NECK: Supple. No masses ?LUNGS: Clear to auscultation. No presence of rhonchi/wheezing/rales. Adequate chest expansion ?HEART: RRR, normal s1 and s2. ?ABDOMEN: Soft, nontender, no guarding, no peritoneal signs, and nondistended.  BS +. No masses. ?EXTREMITIES: Without any cyanosis, clubbing, rash, lesions or edema. ?NEUROLOGIC: AOx3, no focal motor deficit. ?SKIN: no jaundice, no rashes ? ?Imaging/Labs: ?as above ? ?I personally reviewed and interpreted the available labs, imaging and endoscopic files. ? ?Impression and Plan: ?Selena LesserKevin D Martinek is a 51 y.o. male with past medical history of GERD, history of giardiasis, hyperlipidemia, who presents for follow up of bloating.  The patient had presence of recurrent episodes of bloating for which a thorough evaluation in the past has been negative for any organic etiologies.  It is very likely that this is functional in nature/related to IBS, which has actually improved with the use of peppermint components.  He should keep implementing these as needed. ?Regarding his NASH, he has recently lost some weight and has implemented a Mediterranean diet  as much as possible.  We will recheck CBC and CMP to replete his NAFLD score and determine his fibrosis risk. ? ?- Continue Mediterranean diet ?- Continue working on weight loss ?- Continue peppermint as needed for bloating ?- C

## 2022-01-05 LAB — COMPREHENSIVE METABOLIC PANEL
AG Ratio: 1.6 (calc) (ref 1.0–2.5)
ALT: 66 U/L — ABNORMAL HIGH (ref 9–46)
AST: 30 U/L (ref 10–40)
Albumin: 4.6 g/dL (ref 3.6–5.1)
Alkaline phosphatase (APISO): 66 U/L (ref 36–130)
BUN: 16 mg/dL (ref 7–25)
CO2: 26 mmol/L (ref 20–32)
Calcium: 10.4 mg/dL — ABNORMAL HIGH (ref 8.6–10.3)
Chloride: 102 mmol/L (ref 98–110)
Creat: 1.14 mg/dL (ref 0.60–1.29)
Globulin: 2.8 g/dL (calc) (ref 1.9–3.7)
Glucose, Bld: 80 mg/dL (ref 65–99)
Potassium: 4.9 mmol/L (ref 3.5–5.3)
Sodium: 139 mmol/L (ref 135–146)
Total Bilirubin: 1.2 mg/dL (ref 0.2–1.2)
Total Protein: 7.4 g/dL (ref 6.1–8.1)

## 2022-01-05 LAB — CBC WITH DIFFERENTIAL/PLATELET
Absolute Monocytes: 831 cells/uL (ref 200–950)
Basophils Absolute: 28 cells/uL (ref 0–200)
Basophils Relative: 0.4 %
Eosinophils Absolute: 178 cells/uL (ref 15–500)
Eosinophils Relative: 2.5 %
HCT: 48.3 % (ref 38.5–50.0)
Hemoglobin: 17.1 g/dL (ref 13.2–17.1)
Lymphs Abs: 1995 cells/uL (ref 850–3900)
MCH: 34.5 pg — ABNORMAL HIGH (ref 27.0–33.0)
MCHC: 35.4 g/dL (ref 32.0–36.0)
MCV: 97.4 fL (ref 80.0–100.0)
MPV: 9.4 fL (ref 7.5–12.5)
Monocytes Relative: 11.7 %
Neutro Abs: 4068 cells/uL (ref 1500–7800)
Neutrophils Relative %: 57.3 %
Platelets: 304 10*3/uL (ref 140–400)
RBC: 4.96 10*6/uL (ref 4.20–5.80)
RDW: 12.2 % (ref 11.0–15.0)
Total Lymphocyte: 28.1 %
WBC: 7.1 10*3/uL (ref 3.8–10.8)

## 2022-07-28 ENCOUNTER — Encounter (INDEPENDENT_AMBULATORY_CARE_PROVIDER_SITE_OTHER): Payer: Self-pay | Admitting: Gastroenterology

## 2022-08-01 IMAGING — US US ABDOMEN LIMITED
1 series · 14 of 25 positions shown · non-contrast
Comparison: By report from 04/30/2013

CLINICAL DATA: Elevated LFTs

EXAM:
ULTRASOUND ABDOMEN LIMITED RIGHT UPPER QUADRANT

[Series 1: us abdomen limited ruq (liver/gb) · 14 of 34 slices shown]
[im 1/34]
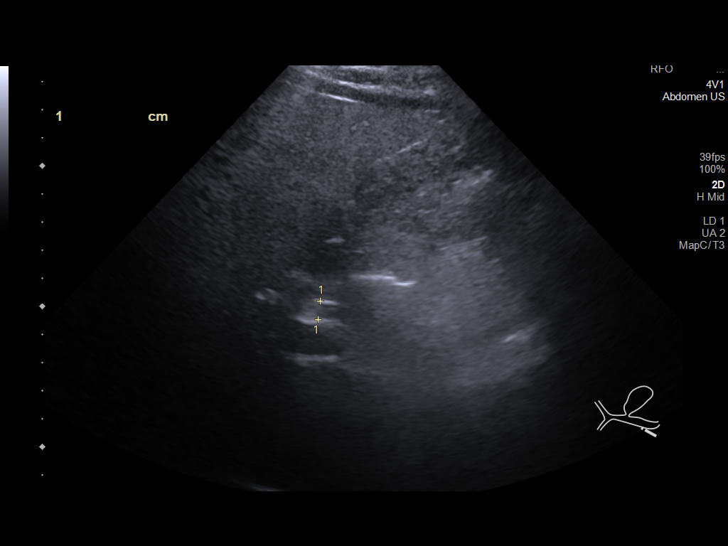
[im 3/34]
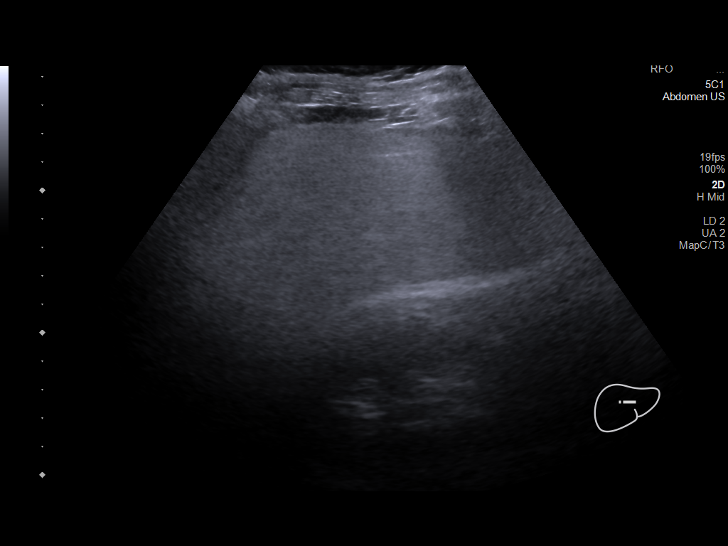
[im 6/34]
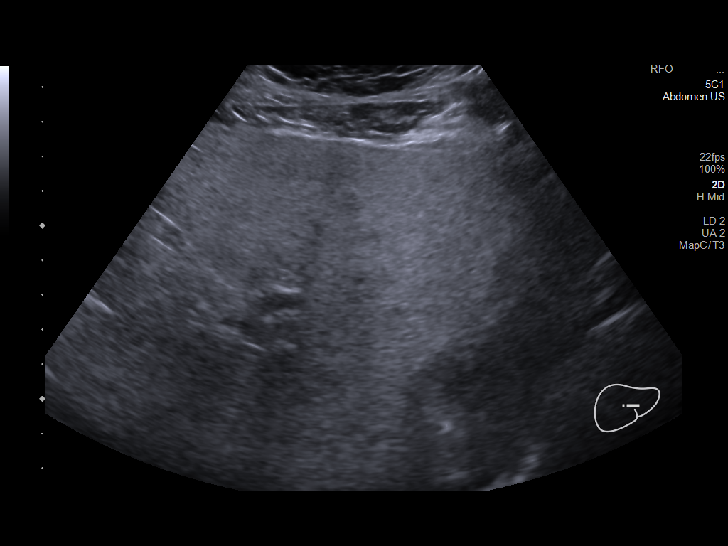
[im 9/34]
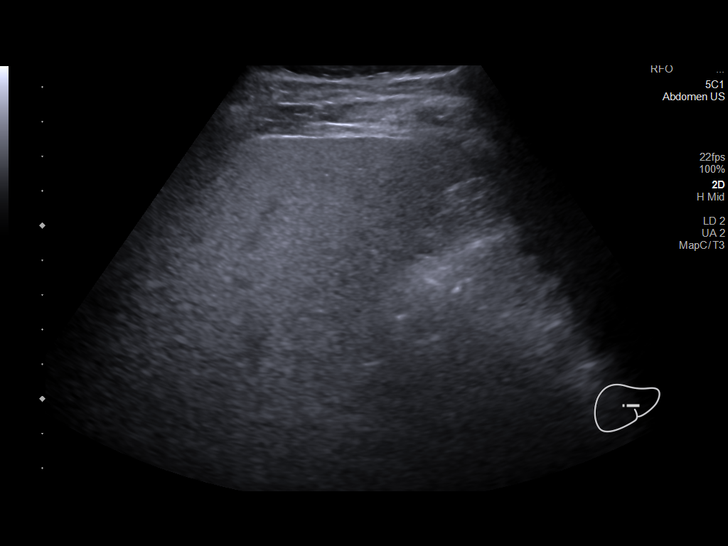
[im 12/34]
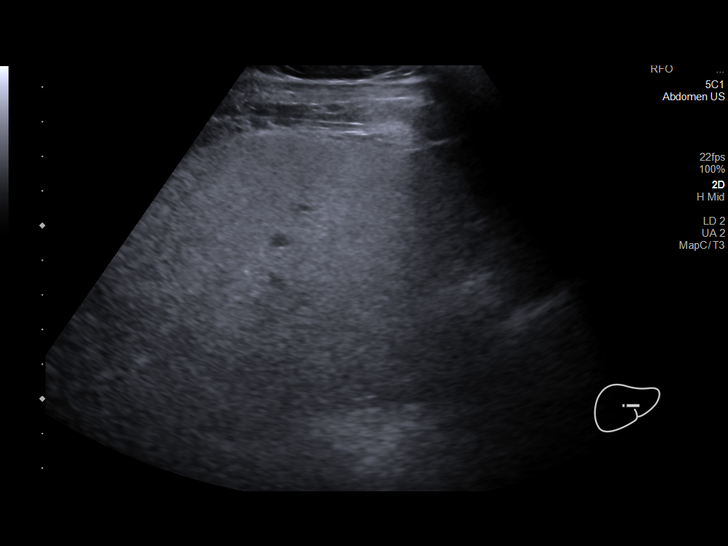
[im 13/34]
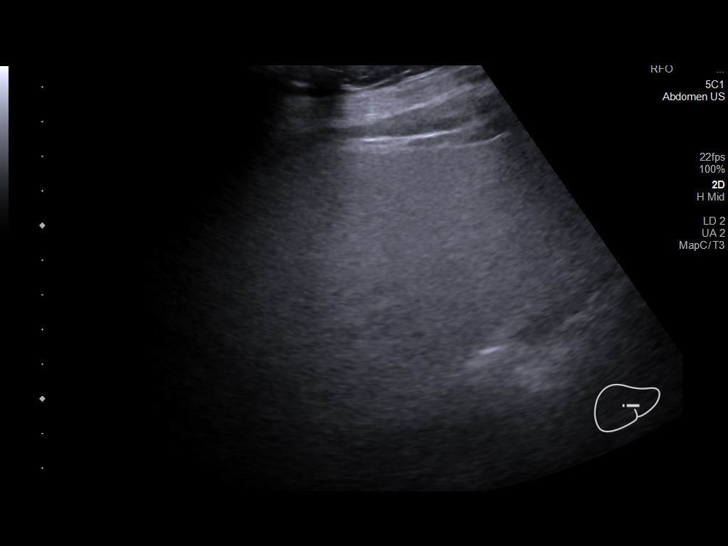
[im 16/34]
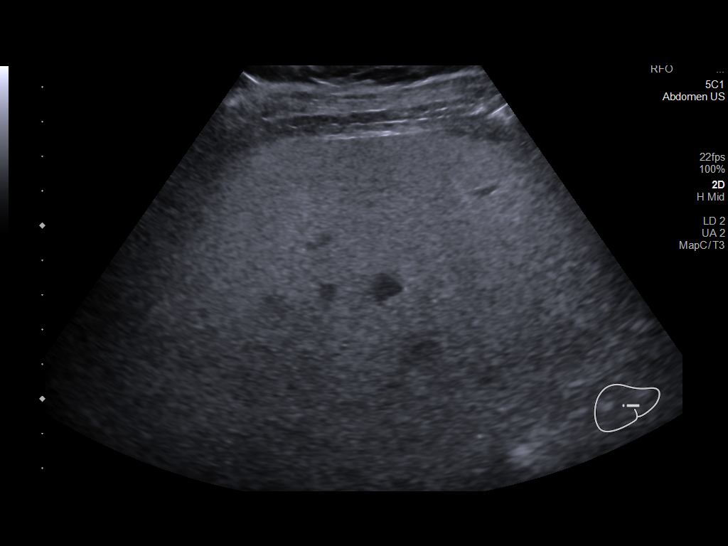
[im 18/34]
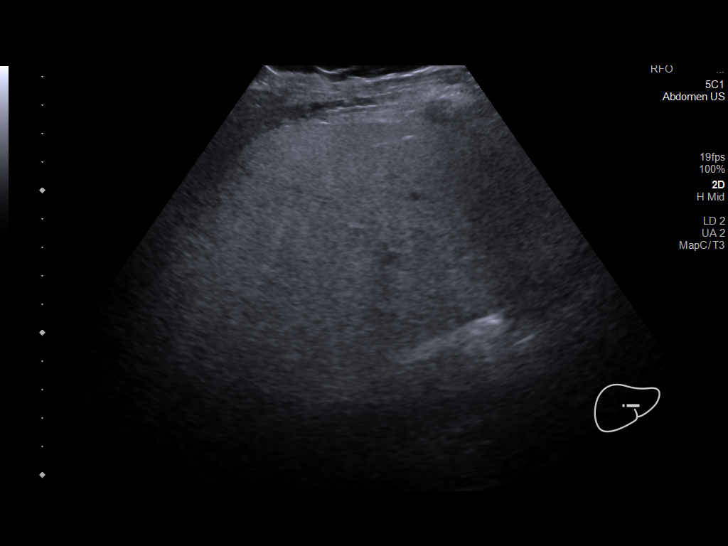
[im 21/34]
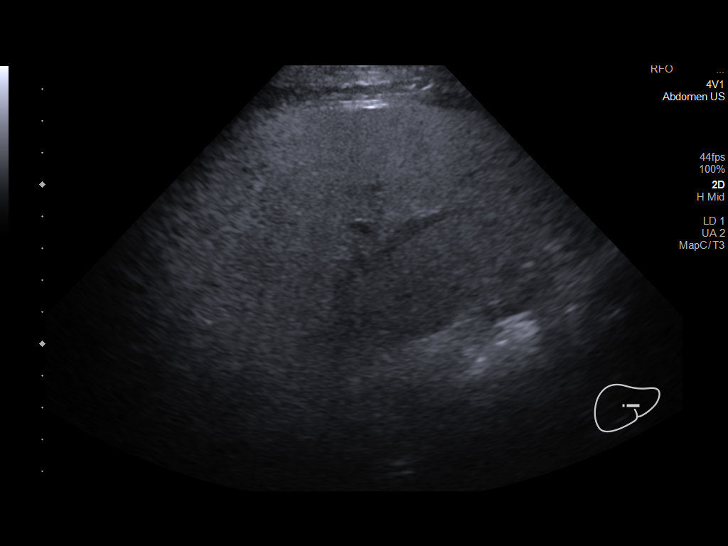
[im 23/34]
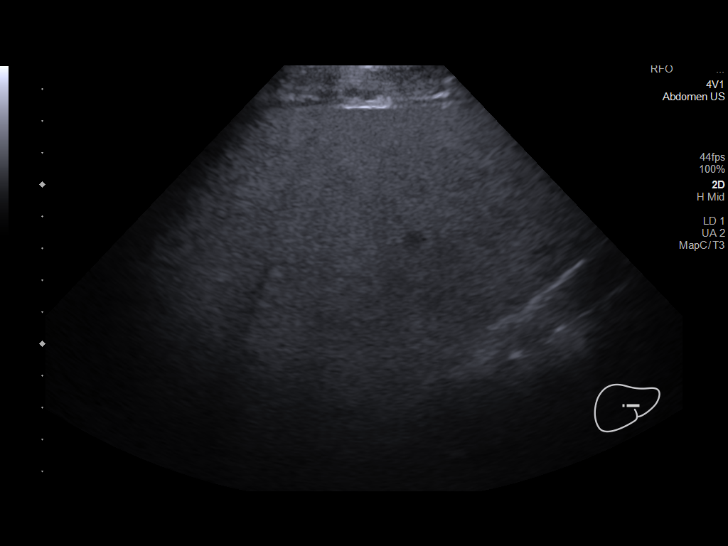
[im 25/34]
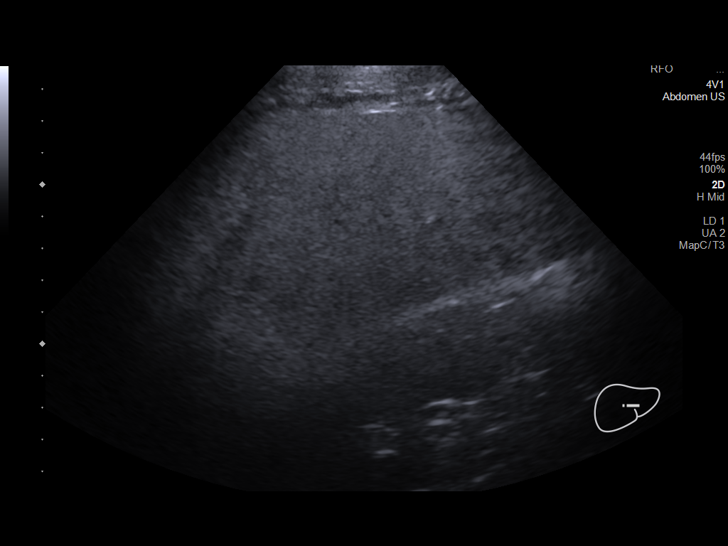
[im 28/34]
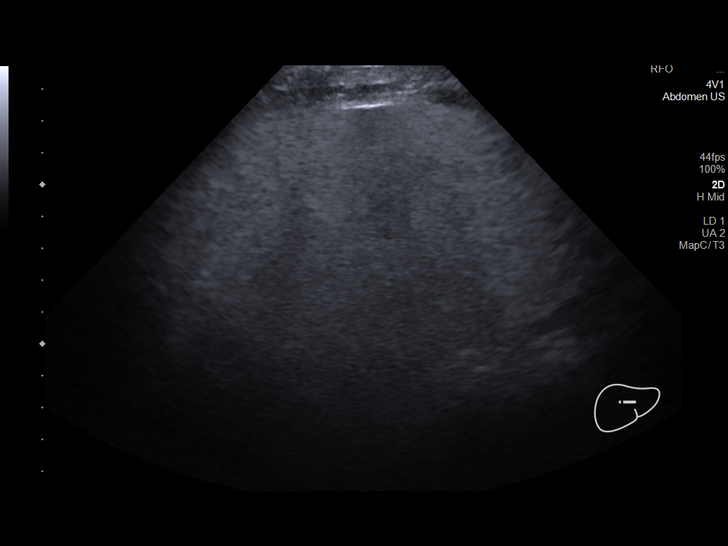
[im 31/34]
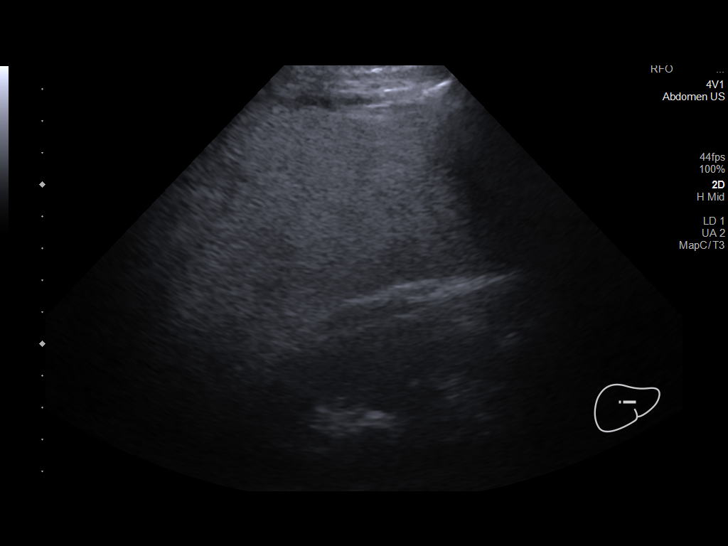
[im 34/34]
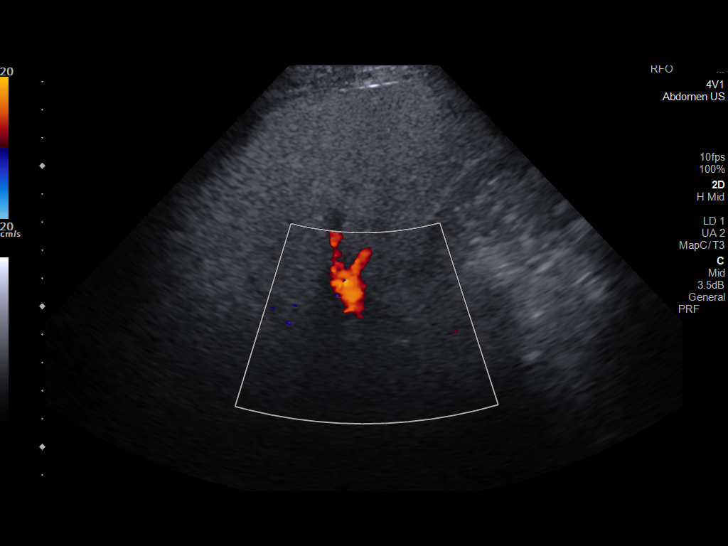

[14 of 25 positions shown; findings below may reference images not displayed]

FINDINGS: Gallbladder:

Surgically removed

Common bile duct:

Diameter: 6.6 mm.

Liver:

Diffusely increased in echogenicity without focal mass. Portal vein
is patent on color Doppler imaging with normal direction of blood
flow towards the liver.

Other: None.
IMPRESSION: Status post cholecystectomy.

Fatty infiltration of the liver.

## 2023-01-07 ENCOUNTER — Encounter (INDEPENDENT_AMBULATORY_CARE_PROVIDER_SITE_OTHER): Payer: Self-pay | Admitting: Gastroenterology

## 2023-01-07 ENCOUNTER — Ambulatory Visit (INDEPENDENT_AMBULATORY_CARE_PROVIDER_SITE_OTHER): Payer: BC Managed Care – PPO | Admitting: Gastroenterology

## 2023-01-07 VITALS — BP 125/93 | HR 96 | Temp 98.6°F | Ht 73.0 in | Wt 239.7 lb

## 2023-01-07 DIAGNOSIS — K580 Irritable bowel syndrome with diarrhea: Secondary | ICD-10-CM | POA: Diagnosis not present

## 2023-01-07 DIAGNOSIS — K7581 Nonalcoholic steatohepatitis (NASH): Secondary | ICD-10-CM | POA: Diagnosis not present

## 2023-01-07 NOTE — Patient Instructions (Addendum)
Continue Mediterranean diet Try FDGard as needed for bloating episodes Ask PCP to fax report of upcoming CBC and CMP

## 2023-01-07 NOTE — Progress Notes (Signed)
Katrinka Blazing, M.D. Gastroenterology & Hepatology [facility] [address] [place], [place] [address]  Primary Care Physician: Lovey Newcomer, [place] [address] [place] [address]  I will communicate my assessment and recommendations to the referring MD via EMR.  Problems: NASH Post infectious IBS - bloating History of giardiasis  History of Present Illness: Mario Jones is a 52 y.o.  male with past medical history of GERD, history of giardiasis, hyperlipidemia, who presents for follow up of bloating and NASH.   The patient was last seen on 01/04/2022. At that time, the patient was advised to continue on Mediterranean diet and continue weight loss measures.  Was also continued on peppermint as needed for bloating.  Repeat labs on 01/04/2022 showed a CBC with platelets 304, hemoglobin 17.1 and WBC 7.1, CMP showed alkaline phosphatase 66, ALT 66, AST 30, total bilirubin 1.2, normal electrolytes and renal function. NAFLD score was -3.59 -no risk for fibrosis.  Patient reports that he has some bloating in his  upper abdomen. He reports trying IBGard but he had a bout of reflux so he stopped it. Occasionally has some ache in his RUQ. The patient denies having any nausea, vomiting, fever, chills, hematochezia, melena, hematemesis, diarrhea, jaundice, pruritus.  He has been trying Mediterranean food as much as possible. He is weighing 13 more pounds than before, but trying to keep very active.  Also trying to cut down on intake of sugars as he developed recurrent nausea when he is exposed to carbohydrates.  Last EGD: 07/2021 - Normal esophagus. - Z-line regular, 40 cm from the incisors. - Multiple fundic gland polyps. - Normal examined duodenum. Biopsied, normal duodenal mucosa Last Colonoscopy: 2022 - The entire examined colon is normal. Biopsied, neg for microscopic colitis - The distal rectum and anal verge are normal on retroflexion  view.  Past Medical History: Past Medical History:  Diagnosis Date   COVID-04 September 2020   GERD (gastroesophageal reflux disease)    History of cardiac catheterization    Normal coronaries April 2017   Hyperlipidemia    Osteoarthritis    Seasonal allergies    Statin intolerance     Past Surgical History: Past Surgical History:  Procedure Laterality Date   ARTHROSCOPY KNEE W/ DRILLING     BIOPSY  02/14/2021   Procedure: BIOPSY;  Surgeon: Dolores Frame, MD;  [facility];  Service: Gastroenterology;;   BIOPSY  07/28/2021   Procedure: BIOPSY;  Surgeon: Dolores Frame, MD;  [facility];  Service: Gastroenterology;;   CARDIAC CATHETERIZATION N/A 12/22/2015   Procedure: Left Heart Cath and Coronary Angiography;  Surgeon: Tonny Bollman, MD;  Location: [facility] INVASIVE CV LAB;  Service: Cardiovascular;  Laterality: N/A;   CHOLECYSTECTOMY     COLONOSCOPY     COLONOSCOPY WITH PROPOFOL N/A 02/14/2021   Procedure: COLONOSCOPY WITH PROPOFOL;  Surgeon: Dolores Frame, MD;  [facility];  Service: Gastroenterology;  Laterality: N/A;  8:15   ESOPHAGOGASTRODUODENOSCOPY (EGD) WITH PROPOFOL N/A 07/28/2021   Procedure: ESOPHAGOGASTRODUODENOSCOPY (EGD) WITH PROPOFOL;  Surgeon: Dolores Frame, MD;  [facility];  Service: Gastroenterology;  Laterality: N/A;  7:30   INGUINAL HERNIA REPAIR     KNEE ARTHROSCOPY W/ ACL RECONSTRUCTION     UPPER GASTROINTESTINAL ENDOSCOPY      Family History: Family History  Problem Relation Age of Onset   Migraines Mother    Autoimmune disease Mother  Heart disease Father        Age 49s   Healthy Son    Asthma Son    Heart disease Sister        Age 54 - sudden cardiac death    Social History: Social History   Tobacco Use  Smoking Status Never  Smokeless Tobacco Former   Types: Chew   Quit date: 08/09/1989   Social History   Substance and Sexual Activity   Alcohol Use No   Alcohol/week: 0.0 standard drinks of alcohol   Social History   Substance and Sexual Activity  Drug Use No    Allergies: Allergies  Allergen Reactions   Gabapentin Rash   Morphine And Related Nausea And Vomiting   Statins Other (See Comments)    Fatigue/muscle & joint pain    Medications: Current Outpatient Medications  Medication Sig Dispense Refill   acetaminophen (TYLENOL) 650 MG CR tablet Take 650 mg by mouth every 8 (eight) hours as needed for pain.     cholecalciferol (VITAMIN D) 25 MCG (1000 UNIT) tablet Take 1,000 Units by mouth in the morning.     ibuprofen (ADVIL,MOTRIN) 200 MG tablet Take 400 mg by mouth every 8 (eight) hours as needed (pain.).     Ibuprofen-diphenhydrAMINE Cit (ADVIL PM) 200-38 MG TABS Take 1 tablet by mouth at bedtime.     loperamide (IMODIUM) 2 MG capsule Take 2-4 mg by mouth 4 (four) times daily as needed for diarrhea or loose stools.     Melatonin 10 MG TABS Take 10 mg by mouth at bedtime.     pantoprazole (PROTONIX) 40 MG tablet Take 40 mg by mouth in the morning.     propranolol ER (INDERAL LA) 60 MG 24 hr capsule Take 60 mg by mouth daily.     hyoscyamine (LEVSIN SL) 0.125 MG SL tablet Place 1 tablet (0.125 mg total) under the tongue every 6 (six) hours as needed (bloating). (Patient not taking: Reported on 01/04/2022) 30 tablet 3   No current facility-administered medications for this visit.    Review of Systems: GENERAL: negative for malaise, night sweats HEENT: No changes in hearing or vision, no nose bleeds or other nasal problems. NECK: Negative for lumps, goiter, pain and significant neck swelling RESPIRATORY: Negative for cough, wheezing CARDIOVASCULAR: Negative for chest pain, leg swelling, palpitations, orthopnea GI: SEE HPI MUSCULOSKELETAL: Negative for joint pain or swelling, back pain, and muscle pain. SKIN: Negative for lesions, rash PSYCH: Negative for sleep disturbance, mood disorder and recent  psychosocial stressors. HEMATOLOGY Negative for prolonged bleeding, bruising easily, and swollen nodes. ENDOCRINE: Negative for cold or heat intolerance, polyuria, polydipsia and goiter. NEURO: negative for tremor, gait imbalance, syncope and seizures. The remainder of the review of systems is noncontributory.   Physical Exam: BP (!) 125/93 (BP Location: Left Arm, Patient Position: Sitting, Cuff Size: Large)   Pulse 96   Temp 98.6 F (37 C) (Temporal)   Ht  (1.854 m)   Wt 239 lb 11.2 oz (108.7 kg)   BMI 31.62 kg/m  GENERAL: The patient is AO x3, in no acute distress. HEENT: Head is normocephalic and atraumatic. EOMI are intact. Mouth is well hydrated and without lesions. NECK: Supple. No masses LUNGS: Clear to auscultation. No presence of rhonchi/wheezing/rales. Adequate chest expansion HEART: RRR, normal s1 and s2. ABDOMEN: Soft, nontender, no guarding, no peritoneal signs, and nondistended. BS +. No masses. EXTREMITIES: Without any cyanosis, clubbing, rash, lesions or edema. NEUROLOGIC: AOx3, no focal motor deficit. SKIN:  no jaundice, no rashes  Imaging/Labs: as above  I personally reviewed and interpreted the available labs, imaging and endoscopic files.  Impression and Plan: OBDULIO MASH is a 52 y.o.  male with past medical history of GERD, history of giardiasis, hyperlipidemia, who presents for follow up of bloating and NASH.  The patient has presented some mild improvement of his IBS symptoms although he is still presenting some frequent bloating episodes.  Given his history of GERD, he was not able to tolerate peppermint.  We discussed the possibility of trying FDGard for dyspeptic symptoms.  No need to perform any further investigations for his bloating.  The patient has a history of NASH.  Has been gaining some weight recently but has tried to implement Mediterranean diet as much as possible.  He will have repeat blood workup soon with his PCP, I asked him to fax the  results of his CBC and CMP to our office.  - Continue Mediterranean diet - Try FDGard as needed for bloating episodes - Ask PCP to fax report of upcoming CBC and CMP  All questions were answered.      Katrinka Blazing, MD Gastroenterology and Hepatology [facility]

## 2023-07-24 ENCOUNTER — Telehealth (INDEPENDENT_AMBULATORY_CARE_PROVIDER_SITE_OTHER): Payer: Self-pay | Admitting: Gastroenterology

## 2023-07-24 NOTE — Telephone Encounter (Signed)
I have placed in my reminders for Feb 2025 so I can send a reminder prior to the March 2025 need.

## 2023-07-24 NOTE — Telephone Encounter (Signed)
I received the results of most recent blood workup performed on 07/18/2023, which showed again elevation of his aminotransferases, higher compared to prior as his ALT was 130, AST was 82, glucose was 107, albumin 4.6, alkaline phosphatase 60, total bilirubin 1.1, total cholesterol was 223, triglycerides 182.  He has had a similar elevation in the past.  Will need to repeat a repeat CMP and CBC in follow-up appointment.  If persistently elevated will discuss the need to proceed with a liver biopsy to rule out other concomitant etiologies for moderately elevated aminotransferases.  Crystal, please set a reminder for repeat CBC and CMP in March 2025.

## 2023-11-01 ENCOUNTER — Other Ambulatory Visit (INDEPENDENT_AMBULATORY_CARE_PROVIDER_SITE_OTHER): Payer: Self-pay

## 2023-11-01 DIAGNOSIS — K7581 Nonalcoholic steatohepatitis (NASH): Secondary | ICD-10-CM

## 2024-01-06 ENCOUNTER — Encounter (INDEPENDENT_AMBULATORY_CARE_PROVIDER_SITE_OTHER): Payer: Self-pay | Admitting: Gastroenterology

## 2024-01-06 ENCOUNTER — Ambulatory Visit (INDEPENDENT_AMBULATORY_CARE_PROVIDER_SITE_OTHER): Payer: BC Managed Care – PPO | Admitting: Gastroenterology

## 2024-01-06 VITALS — BP 145/90 | HR 96 | Temp 98.0°F | Ht 73.0 in | Wt 225.5 lb

## 2024-01-06 DIAGNOSIS — R140 Abdominal distension (gaseous): Secondary | ICD-10-CM | POA: Diagnosis not present

## 2024-01-06 DIAGNOSIS — K7581 Nonalcoholic steatohepatitis (NASH): Secondary | ICD-10-CM | POA: Diagnosis not present

## 2024-01-06 DIAGNOSIS — K580 Irritable bowel syndrome with diarrhea: Secondary | ICD-10-CM

## 2024-01-06 NOTE — Patient Instructions (Signed)
 Perform blood workup Continue working on weight loss and dietary modifications.

## 2024-01-06 NOTE — Progress Notes (Signed)
 Samantha Cress, M.D. Gastroenterology & Hepatology [facility] Gastroenterology [address] [place], [place] [address]  Primary Care Physician: Jolynn Needy, [place] [address] [place] [address]  I will communicate my assessment and recommendations to the referring MD via EMR.  Problems: NASH Post infectious IBS - bloating History of giardiasis   History of Present Illness: Mario Jones is a 53 y.o.  male with past medical history of GERD, history of giardiasis, hyperlipidemia, who presents for follow up of bloating and NASH.   The patient was last seen on 01/07/2023. At that time, the patient was continued on Mediterranean diet and was advised to take FDgard for bloating episodes.  Had some heartburn with FDGard and no improvement so he stopped it. He has noticed some bloating with starchy food like potatoes.  Nevertheless, and the bloating is still present it does not cause him significant discomfort.  The patient denies having any nausea, vomiting, fever, chills, hematochezia, melena, hematemesis,  abdominal pain, diarrhea, jaundice, pruritus. Has lost 14 lb since the last seen in clinic.  Last EGD: 07/2021 - Normal esophagus. - Z-line regular, 40 cm from the incisors. - Multiple fundic gland polyps. - Normal examined duodenum. Biopsied, normal duodenal mucosa  Last Colonoscopy: 2022 - The entire examined colon is normal. Biopsied, neg for microscopic colitis - The distal rectum and anal verge are normal on retroflexion view.  Past Medical History: Past Medical History:  Diagnosis Date   COVID-04 September 2020   GERD (gastroesophageal reflux disease)    History of cardiac catheterization    Normal coronaries April 2017   Hyperlipidemia    Osteoarthritis    Seasonal allergies    Statin intolerance     Past Surgical History: Past Surgical History:  Procedure Laterality Date   ARTHROSCOPY KNEE W/ DRILLING     BIOPSY   02/14/2021   Procedure: BIOPSY;  Surgeon: Urban Garden, MD;  Location: [facility];  Service: Gastroenterology;;   BIOPSY  07/28/2021   Procedure: BIOPSY;  Surgeon: Urban Garden, MD;  Location: [facility];  Service: Gastroenterology;;   CARDIAC CATHETERIZATION N/A 12/22/2015   Procedure: Left Heart Cath and Coronary Angiography;  Surgeon: Arnoldo Lapping, MD;  Location: [facility] INVASIVE CV LAB;  Service: Cardiovascular;  Laterality: N/A;   CHOLECYSTECTOMY     COLONOSCOPY     COLONOSCOPY WITH PROPOFOL  N/A 02/14/2021   Procedure: COLONOSCOPY WITH PROPOFOL ;  Surgeon: Urban Garden, MD;  Location: [facility];  Service: Gastroenterology;  Laterality: N/A;  8:15   ESOPHAGOGASTRODUODENOSCOPY (EGD) WITH PROPOFOL  N/A 07/28/2021   Procedure: ESOPHAGOGASTRODUODENOSCOPY (EGD) WITH PROPOFOL ;  Surgeon: Urban Garden, MD;  Location: [facility];  Service: Gastroenterology;  Laterality: N/A;  7:30   INGUINAL HERNIA REPAIR     KNEE ARTHROSCOPY W/ ACL RECONSTRUCTION     UPPER GASTROINTESTINAL ENDOSCOPY      Family History: Family History  Problem Relation Age of Onset   Migraines Mother    Autoimmune disease Mother    Heart disease Father        Age 80s   Healthy Son    Asthma Son    Heart disease Sister        Age 71 - sudden cardiac death    Social History: Social History   Tobacco Use  Smoking Status Never  Smokeless Tobacco Former   Types: Chew   Quit date: 08/09/1989   Social History   Substance  and Sexual Activity  Alcohol Use No   Alcohol/week: 0.0 standard drinks of alcohol   Social History   Substance and Sexual Activity  Drug Use No    Allergies: Allergies  Allergen Reactions   Gabapentin Rash   Morphine And Codeine Nausea And Vomiting   Cat Dander    Statins Other (See Comments)    Fatigue/muscle & joint pain    Medications: Current Outpatient Medications  Medication Sig Dispense Refill   acetaminophen   (TYLENOL ) 650 MG CR tablet Take 650 mg by mouth every 8 (eight) hours as needed for pain.     cholecalciferol (VITAMIN D) 25 MCG (1000 UNIT) tablet Take 1,000 Units by mouth in the morning.     ibuprofen (ADVIL,MOTRIN) 200 MG tablet Take 400 mg by mouth every 8 (eight) hours as needed (pain.).     Ibuprofen-diphenhydrAMINE Cit (ADVIL PM) 200-38 MG TABS Take 1 tablet by mouth at bedtime.     loperamide (IMODIUM) 2 MG capsule Take 2-4 mg by mouth 4 (four) times daily as needed for diarrhea or loose stools.     Melatonin 10 MG TABS Take 10 mg by mouth at bedtime.     pantoprazole (PROTONIX) 40 MG tablet Take 40 mg by mouth in the morning.     propranolol ER (INDERAL LA) 60 MG 24 hr capsule Take 60 mg by mouth daily.     No current facility-administered medications for this visit.    Review of Systems: GENERAL: negative for malaise, night sweats HEENT: No changes in hearing or vision, no nose bleeds or other nasal problems. NECK: Negative for lumps, goiter, pain and significant neck swelling RESPIRATORY: Negative for cough, wheezing CARDIOVASCULAR: Negative for chest pain, leg swelling, palpitations, orthopnea GI: SEE HPI MUSCULOSKELETAL: Negative for joint pain or swelling, back pain, and muscle pain. SKIN: Negative for lesions, rash PSYCH: Negative for sleep disturbance, mood disorder and recent psychosocial stressors. HEMATOLOGY Negative for prolonged bleeding, bruising easily, and swollen nodes. ENDOCRINE: Negative for cold or heat intolerance, polyuria, polydipsia and goiter. NEURO: negative for tremor, gait imbalance, syncope and seizures. The remainder of the review of systems is noncontributory.   Physical Exam: BP (!) 145/90 (BP Location: Left Arm, Patient Position: Sitting, Cuff Size: Large)   Pulse 96   Temp 98 F (36.7 C) (Temporal)   Ht 6\' 1"  (1.854 m)   Wt 225 lb 8 oz (102.3 kg)   BMI 29.75 kg/m  GENERAL: The patient is AO x3, in no acute distress. HEENT: Head is  normocephalic and atraumatic. EOMI are intact. Mouth is well hydrated and without lesions. NECK: Supple. No masses LUNGS: Clear to auscultation. No presence of rhonchi/wheezing/rales. Adequate chest expansion HEART: RRR, normal s1 and s2. ABDOMEN: Soft, nontender, no guarding, no peritoneal signs, and nondistended. BS +. No masses. EXTREMITIES: Without any cyanosis, clubbing, rash, lesions or edema. NEUROLOGIC: AOx3, no focal motor deficit. SKIN: no jaundice, no rashes  Imaging/Labs: as above  I personally reviewed and interpreted the available labs, imaging and endoscopic files.  Impression and Plan: KYLEN SCHLIEP is a 53 y.o.  male with past medical history of GERD, history of giardiasis, hyperlipidemia, who presents for follow up of bloating and NASH.  Patient has presented persistent bloating issues but no other red flag signs.  It is likely is related to IBS which he may need to manage with dietary modifications.  Regarding his NASH, he had significant elevation of his LFTs in the past.  Fortunately, he has been able to  lose some weight recently.  We will repeat blood workup to evaluate liver damage and indirectly evaluate the presence of advanced liver fibrosis, in order to assess candidacy for Rezdiffra.  -CBC, CMP, FIB-4 with reflex ELF -Continue working on weight loss and dietary modifications.  All questions were answered.      Samantha Cress, MD Gastroenterology and Hepatology [facility] Gastroenterology

## 2024-07-01 ENCOUNTER — Encounter (INDEPENDENT_AMBULATORY_CARE_PROVIDER_SITE_OTHER): Payer: Self-pay | Admitting: Gastroenterology
